# Patient Record
Sex: Male | Born: 1935 | Race: White | Hispanic: No | Marital: Married | Smoking: Never smoker
Health system: Southern US, Community
[De-identification: ages and names within clinical notes are randomized; demographics above are authoritative.]

## PROBLEM LIST (undated history)

## (undated) DIAGNOSIS — K219 Gastro-esophageal reflux disease without esophagitis: Secondary | ICD-10-CM

## (undated) DIAGNOSIS — F3289 Other specified depressive episodes: Secondary | ICD-10-CM

## (undated) DIAGNOSIS — E785 Hyperlipidemia, unspecified: Secondary | ICD-10-CM

## (undated) DIAGNOSIS — N182 Chronic kidney disease, stage 2 (mild): Secondary | ICD-10-CM

## (undated) DIAGNOSIS — F329 Major depressive disorder, single episode, unspecified: Secondary | ICD-10-CM

## (undated) DIAGNOSIS — I6529 Occlusion and stenosis of unspecified carotid artery: Secondary | ICD-10-CM

## (undated) DIAGNOSIS — E559 Vitamin D deficiency, unspecified: Secondary | ICD-10-CM

## (undated) DIAGNOSIS — M1710 Unilateral primary osteoarthritis, unspecified knee: Secondary | ICD-10-CM

## (undated) DIAGNOSIS — IMO0002 Reserved for concepts with insufficient information to code with codable children: Secondary | ICD-10-CM

## (undated) DIAGNOSIS — R7301 Impaired fasting glucose: Secondary | ICD-10-CM

## (undated) DIAGNOSIS — F411 Generalized anxiety disorder: Secondary | ICD-10-CM

## (undated) DIAGNOSIS — G4733 Obstructive sleep apnea (adult) (pediatric): Secondary | ICD-10-CM

## (undated) DIAGNOSIS — J309 Allergic rhinitis, unspecified: Secondary | ICD-10-CM

## (undated) DIAGNOSIS — R413 Other amnesia: Secondary | ICD-10-CM

## (undated) DIAGNOSIS — R2689 Other abnormalities of gait and mobility: Secondary | ICD-10-CM

## (undated) HISTORY — DX: Occlusion and stenosis of unspecified carotid artery: I65.29

## (undated) HISTORY — DX: Other abnormalities of gait and mobility: R26.89

## (undated) HISTORY — DX: Vitamin D deficiency, unspecified: E55.9

## (undated) HISTORY — DX: Unilateral primary osteoarthritis, unspecified knee: M17.10

## (undated) HISTORY — DX: Gastro-esophageal reflux disease without esophagitis: K21.9

## (undated) HISTORY — DX: Hyperlipidemia, unspecified: E78.5

## (undated) HISTORY — DX: Generalized anxiety disorder: F41.1

## (undated) HISTORY — DX: Impaired fasting glucose: R73.01

## (undated) HISTORY — PX: CAROTID ENDARTERECTOMY: SUR193

## (undated) HISTORY — DX: Other specified depressive episodes: F32.89

## (undated) HISTORY — DX: Chronic kidney disease, stage 2 (mild): N18.2

## (undated) HISTORY — DX: Major depressive disorder, single episode, unspecified: F32.9

## (undated) HISTORY — DX: Allergic rhinitis, unspecified: J30.9

## (undated) HISTORY — PX: TURP VAPORIZATION: SUR1397

## (undated) HISTORY — DX: Reserved for concepts with insufficient information to code with codable children: IMO0002

## (undated) HISTORY — DX: Obstructive sleep apnea (adult) (pediatric): G47.33

## (undated) HISTORY — DX: Other amnesia: R41.3

---

## 2000-09-30 ENCOUNTER — Encounter: Admission: RE | Admit: 2000-09-30 | Discharge: 2000-09-30 | Payer: Self-pay | Admitting: Family Medicine

## 2000-09-30 ENCOUNTER — Encounter: Payer: Self-pay | Admitting: Family Medicine

## 2003-01-01 ENCOUNTER — Encounter: Payer: Self-pay | Admitting: Family Medicine

## 2003-01-01 ENCOUNTER — Encounter: Admission: RE | Admit: 2003-01-01 | Discharge: 2003-01-01 | Payer: Self-pay | Admitting: Family Medicine

## 2003-01-03 ENCOUNTER — Encounter: Payer: Self-pay | Admitting: Family Medicine

## 2003-01-03 ENCOUNTER — Encounter: Admission: RE | Admit: 2003-01-03 | Discharge: 2003-01-03 | Payer: Self-pay | Admitting: Family Medicine

## 2003-08-22 ENCOUNTER — Encounter: Admission: RE | Admit: 2003-08-22 | Discharge: 2003-08-22 | Payer: Self-pay | Admitting: Family Medicine

## 2004-11-28 ENCOUNTER — Encounter (INDEPENDENT_AMBULATORY_CARE_PROVIDER_SITE_OTHER): Payer: Self-pay | Admitting: *Deleted

## 2004-11-28 ENCOUNTER — Inpatient Hospital Stay (HOSPITAL_COMMUNITY): Admission: RE | Admit: 2004-11-28 | Discharge: 2004-11-29 | Payer: Self-pay | Admitting: Vascular Surgery

## 2005-08-17 ENCOUNTER — Encounter: Admission: RE | Admit: 2005-08-17 | Discharge: 2005-08-17 | Payer: Self-pay | Admitting: Family Medicine

## 2005-12-30 ENCOUNTER — Ambulatory Visit: Payer: Self-pay | Admitting: Gastroenterology

## 2006-01-12 ENCOUNTER — Ambulatory Visit: Payer: Self-pay | Admitting: Gastroenterology

## 2006-01-12 ENCOUNTER — Encounter (INDEPENDENT_AMBULATORY_CARE_PROVIDER_SITE_OTHER): Payer: Self-pay | Admitting: Specialist

## 2007-01-04 ENCOUNTER — Ambulatory Visit: Payer: Self-pay | Admitting: Vascular Surgery

## 2008-04-03 ENCOUNTER — Ambulatory Visit: Payer: Self-pay | Admitting: Vascular Surgery

## 2008-04-11 ENCOUNTER — Encounter: Admission: RE | Admit: 2008-04-11 | Discharge: 2008-04-11 | Payer: Self-pay | Admitting: Family Medicine

## 2008-09-12 ENCOUNTER — Emergency Department (HOSPITAL_COMMUNITY): Admission: EM | Admit: 2008-09-12 | Discharge: 2008-09-12 | Payer: Self-pay | Admitting: Emergency Medicine

## 2008-09-13 ENCOUNTER — Inpatient Hospital Stay (HOSPITAL_COMMUNITY): Admission: EM | Admit: 2008-09-13 | Discharge: 2008-09-16 | Payer: Self-pay | Admitting: Emergency Medicine

## 2008-10-16 ENCOUNTER — Ambulatory Visit (HOSPITAL_COMMUNITY): Admission: RE | Admit: 2008-10-16 | Discharge: 2008-10-16 | Payer: Self-pay | Admitting: Neurosurgery

## 2009-09-28 HISTORY — PX: INGUINAL HERNIA REPAIR: SHX194

## 2010-02-28 ENCOUNTER — Encounter: Admission: RE | Admit: 2010-02-28 | Discharge: 2010-02-28 | Payer: Self-pay | Admitting: General Surgery
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
## 2011-02-10 NOTE — Assessment & Plan Note (Signed)
OFFICE VISIT   [name], Allen Vega  DOB:  March 13, 1925                                       04/03/2008  ZOXWR#:[id number]   The patient is status post right-sided endarterectomy done in March of  2006 for severe right internal carotid stenosis.  He had suffered some  episodes of severe dizziness on two occasions and he had an 80-90% right  internal carotid stenosis.  He has done well since that time.  He was  referred back today because of some recent dizziness which he has  experienced.  Each spell lasting up to 1 minute.  He becomes nauseated  but does not have syncope or any other neurologic symptoms such as  hemiparesis, aphasia, amaurosis fugax, diplopia, although he does have  times when he has a fuzzy vision around the perimeter of both visual  fields but nothing in one eye only.  He has been taking Aggrenox.  He  has had no cardiac symptoms such as chest pain, dyspnea on exertion, PND  or orthopnea.  He has no claudication symptoms but does not ambulate  long distances because of arthritis.   PHYSICAL EXAMINATION:  Vital signs:  Blood pressure 136/72, heart rate  66, respirations 14.  Carotid pulses 3+ with no audible bruits.  Right  neck incision is well healed.  Neurological:  Normal.  Chest:  Clear to  auscultation.  Cardiovascular:  Reveals a regular rhythm with no  murmurs.  Abdomen:  Soft, nontender with no masses.  There are 3+  femoral, popliteal and posterior tibial pulses palpable bilaterally.   Carotid duplex exam reveals no evidence of restenosis in his right  carotid endarterectomy site and the left internal carotid has minimal  stenosis.  Both vertebral arteries have antegrade flow.   There is no evidence of a vascular cause for his dizziness and I do not  think he is having posterior circulation TIAs per se.  I do not think  any further vascular evaluation is necessary at this time.   Quita Skye Hart Rochester, M.D.  Electronically Signed   JDL/MEDQ  D:  04/03/2008  T:  04/04/2008  Job:  [id number]   cc:   Quita Skye. Artis Flock, M.D.

## 2011-02-10 NOTE — Procedures (Signed)
CAROTID DUPLEX EXAM   INDICATION:  Dizziness.   HISTORY:  Diabetes:  No.  Cardiac:  No.  Hypertension:  No.  Smoking:  Quit >10 years ago.  Previous Surgery:  Right carotid endarterectomy on 11/28/04.  CV History:  No.  Amaurosis Fugax No, Paresthesias No, Hemiparesis No.                                       RIGHT             LEFT  Brachial systolic pressure:         148               138  Brachial Doppler waveforms:         Normal            Normal  Vertebral direction of flow:        Antegrade         Antegrade  DUPLEX VELOCITIES (cm/sec)  CCA peak systolic                   133               140  ECA peak systolic                   134               141  ICA peak systolic                   75                89  ICA end diastolic                   12                20  PLAQUE MORPHOLOGY:                  None              Mixed  PLAQUE AMOUNT:                      None              Mild  PLAQUE LOCATION:                    None  Bifurcation/proximal ICA   IMPRESSION:  1. Patent right carotid endarterectomy site with no evidence of      stenosis.  2. 1-39% stenosis of the left internal carotid artery.  3. No significant change noted in the bilateral carotid arteries since      the previous examination on 01/04/07.   ___________________________________________  Quita Skye. Hart Rochester, M.D.   CH/MEDQ  D:  04/03/2008  T:  04/03/2008  Job:  [id number]

## 2011-02-13 NOTE — Discharge Summary (Signed)
Allen Vega, Allen Vega NO.:  [id number]   MEDICAL RECORD NO.:  [id number]          PATIENT TYPE:  INP   LOCATION:  3312                         FACILITY:  [facility]   PHYSICIAN:  Quita Skye. Hart Rochester, M.D.  DATE OF BIRTH:  August 06, 1925   DATE OF ADMISSION:  11/28/2004  DATE OF DISCHARGE:  11/29/2004                                 DISCHARGE SUMMARY   ADMISSION DIAGNOSIS:  Severe right internal carotid artery stenosis,  asymptomatic.   DISCHARGE DIAGNOSIS:  1.  Severe right internal carotid artery stenosis, asymptomatic, status post      right carotid artery endarterectomy.  2.  Hyperlipidemia.  3.  History of prostate surgery.  4.  History of bladder and urethral surgeries.  5.  Hyperlipidemia.  6.  History of hemorrhoids.  7.  No known drug allergies.  8.  History of hiatal hernia with history of reflux esophagitis.   PROCEDURE:  November 28, 2004, right carotid endarterectomy with Dacron patch  angioplasty by Dr. Quita Skye. Hart Rochester.   BRIEF HISTORY:  Mr. Allen Vega is a 77 year old male who was found to have  significant right carotid occlusive disease in 2004 after suffering two  dizzy spells lasting less than a minute which he described as the room  spinning.  These resolved spontaneously.  He denied any history of  hemiparesis, dysphagia, visual changes, diplopia, or true syncope.  He was  evaluated by Dr. Hart Rochester in December 2004 and found to have 70% right  internal carotid artery stenosis based on a duplex scan done at [facility].  There was mild stenosis on the left.  Their recommendation was  to follow this with serial carotid Duplex.  Repeat Duplex done at [facility] on October 22, 2004, revealed significant progression of  disease to 80% or greater stenosis in the right internal carotid artery.  He  was referred back to Dr. Quita Skye. Hart Rochester for possible right carotid  endarterectomy.  He remained asymptomatic.   HOSPITAL COURSE:  Mr.  Kalb was electively admitted to [facility]  on November 28, 2004, and did undergo a right carotid endarterectomy.  There  were no known interoperative complications and he was extubated  neurologically intact.  After a short stay in the recovery unit, he was  transferred to unit 3300 step down unit for further management.  On the  morning of postoperative day one, Mr. Trunnell remained hemodynamically stable.  He was saturating 98% on room air.  He was neurologically intact with no  dysphagia.  His only complaint was a mild sore throat.  There was very  slight tongue deviation to the right.  His neck dressing was clean, dry, and  intact with no evidence of hematoma or drainage.  His lungs were clear.  His  heart was regular.  His abdomen was soft and nontender.  Early that morning,  the Foley catheter was discontinued as well as his arterial line.  Later, he  was able to void without difficulty.  He was also advanced to a regular diet  and mobilized in the hallway.  Since  he had met the above criteria, it was  felt that he would be stable for discharge home on November 29, 2004.   DISCHARGE MEDICATIONS:  1.  Lipitor 40 mg daily.  2.  Aggrenox 25/200 SR 1 capsule b.i.d.  3.  Aspirin 81 mg daily.  4.  Prilosec 20 mg daily.  5.  Multi-vitamin daily.  6.  Etodolac 500 mg 2 tablets daily.  7.  Coenzyme Q10 100 mg daily.  8.  L-Citrulline 600 mg daily.  9.  L-Arginine 500 mg t.i.d.  10. Over the counter antihistamine p.r.n.  11. Tylox 1-2 tablets p.o. q.4h. p.r.n. pain.   DISCHARGE INSTRUCTIONS:  Activities:  He is instructed to avoid driving or  heavy lifting more than 10 pounds and strenuous exercises.  He was to  continue to increase his daily walking as able.  He is to follow a low fat,  low salt diet.  He may shower starting November 30, 2004.  He should notify the  CVTS office if he develops fever greater than 101, redness or drainage from  his incision site, or changes in  his  neurological status.  He is to follow  up with Dr. Hart Rochester at the CVTS office on Tuesday, December 09, 2004, at 1:40  p.m.      AWZ/MEDQ  D:  01/27/2005  T:  01/27/2005  Job:  [id number]

## 2011-02-13 NOTE — H&P (Signed)
NAMEISAIR, INABINET NO.:  [id number]   MEDICAL RECORD NO.:  [id number]          PATIENT TYPE:  INP   LOCATION:                               FACILITY:  [facility]   PHYSICIAN:  Quita Skye. Hart Rochester, M.D.  DATE OF BIRTH:  November 06, 1924   DATE OF ADMISSION:  11/28/2004  DATE OF DISCHARGE:                                HISTORY & PHYSICAL   CHIEF COMPLAINT:  Severe right internal carotid stenosis - asymptomatic.   HISTORY OF PRESENT ILLNESS:  This 77 year old gentleman was found to have  significant right carotid occlusive disease in 2004 after suffering 2 dizzy  spells lasting less than a minute, which he described as the room spinning,  but him unable to stand.  These resolved spontaneously, and he denied any  other specific hemispheric TIAs such as hemiparesis, aphasia, visual  symptoms, diplopia, or true syncope, and has had no symptoms since that  time.  He was evaluated by me in December of 2004, and was thought to have a  70% right internal carotid stenosis based on duplex scan done at [facility], with a mild stenosis on the left side.  The recommendation was to  follow this.  Repeat duplex scan done at [facility] on  October 22, 2004 revealed significant progression of disease to an 80% or  greater stenosis in the right internal carotid.  He was referred for further  evaluation.  He continues to be asymptomatic.   PAST MEDICAL HISTORY:  1.  Hyperlipidemia.  2.  Negative for diabetes, coronary artery disease, stroke, COPD, deep vein      thrombosis, pulmonary emboli, or hypertension.   PREVIOUS SURGERY:  1.  Prostate surgery.  2.  Bladder and urethra surgery in the past.   FAMILY HISTORY:  Positive for diabetes in his mother, and coronary artery  disease and stroke in his father and 1 sister.   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  Prior to  that, he smoked a pack a day for 30-40 years.  He does not use  alcohol.   REVIEW OF SYSTEMS:  He has occasional discomfort in his legs with walking,  usually related to his knees and arthritis.  He denies any cardiac symptoms  such as chest pain, dyspnea on exertion, PND, orthopnea.  Also denies  pulmonary symptoms such as hemoptysis, chronic bronchitis, or any  generalized symptoms such as anorexia or weight loss.  He does have a  history of a hiatal hernia with reflux esophagitis, and some chronic  constipation.   ALLERGIES:  None known.   MEDICATIONS:  1.  Aggrenox 200 mg two per day.  2.  Lipitor 40 mg one per day.  3.  Etodolac 500 mg two per day.  4.  Prilosec 20 mg one per day.  5.  CoQ10 100 mg one per day.  6.  L-Citrulline 600 mg one per day.  7.  L-Arginine 500 mg three per day.  8.  Multivitamins daily.  9.  An antihistamine as necessary.   PHYSICAL EXAMINATION:  VITAL  SIGNS:  Blood pressure 130/80, heart rate 70,  respirations 16.  GENERAL:  This is a healthy-appearing male who is in no apparent distress.  He is alert and oriented x3.  NECK:  Supple with 3+ carotid pulses, with a soft bruit audible on the  right.  There is no palpable adenopathy in the neck.  NEUROLOGIC:  Normal.  His extraocular muscles were intact.  CHEST:  Clear to auscultation.  CARDIOVASCULAR:  Regular rhythm with no murmurs.  ABDOMEN:  Soft, nontender, with no palpable masses or organomegaly.  EXTREMITIES:  Upper extremity pulses were 3+ bilaterally at the brachial and  radial level.  He has 3+ femoral, popliteal, and posterior tibia pulses  palpable bilaterally with no edema or ischemia.   A repeat carotid duplex exam performed in the CVTS office on November 18, 2004 was consistent with an approximate 80% right internal carotid stenosis.  After a discussion with the patient of risks and benefits, the patient and  his wife decided that he would proceed with right carotid endarterectomy.   IMPRESSION:  1.  Severe right internal carotid stenosis -  asymptomatic.  2.  Hiatal hernia with history of reflux esophagitis.  3.  Hyperlipidemia.   PLAN:  Admit on Friday, November 28, 2004, for an elective right carotid  endarterectomy.       ___________________________________________  Quita Skye Hart Rochester, M.D.    JDL/MEDQ  D:  11/18/2004  T:  11/18/2004  Job:  [id number]   cc:   Jaclyn Prime. Lucas Mallow, M.D.  [address]  [place]  [place] [address]  Fax: [phone]   Quita Skye. Artis Flock, M.D.  [address], [address]  [place]  [place] [address]  Fax: [phone]
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
## 2011-02-13 NOTE — Op Note (Signed)
NAMEDOUGLASS, DUNSHEE NO.:  [id number]   MEDICAL RECORD NO.:  [id number]          PATIENT TYPE:  INP   LOCATION:  2899                         FACILITY:  [facility]   PHYSICIAN:  Quita Skye. Hart Rochester, M.D.  DATE OF BIRTH:  1925/04/02   DATE OF PROCEDURE:  11/28/2004  DATE OF DISCHARGE:                                 OPERATIVE REPORT   REFERRING PHYSICIAN:  Jaclyn Prime. Lucas Mallow, M.D.   PREOPERATIVE DIAGNOSIS:  Right internal carotid artery stenosis with  possible symptoms of diffuse cerebral ischemia.   POSTOPERATIVE DIAGNOSIS:  Right internal carotid artery stenosis with  possible symptoms of diffuse cerebral ischemia.   OPERATION:  Right carotid endarterectomy with Dacron patch angioplasty.   SURGEON:  Quita Skye. Hart Rochester, M.D.   FIRST ASSISTANT:  Jerold Coombe, P.A.   ANESTHESIA:  General endotracheal anesthesia.   BRIEF HISTORY:  This patient has been known to have moderate carotid  occlusive disease in the past which has been followed.  Recently, he had an  episode of severe dizziness on two occasions and repeat carotid Duplex exam  revealed progression of disease to an 80 to 90% level and he was scheduled  for a right carotid endarterectomy.  He had mild disease on the  contralateral left side.   PROCEDURE:  The patient was taken to the operating room and placed in the  supine position at which time satisfactory general endotracheal anesthesia  was administered.  The right neck was prepped with Betadine scrubbing  solution and draped in routine sterile manner.  Incision was made along the  anterior border of the sternocleidomastoid muscle and carried down to the  subcutaneous tissue and platysma using the Bovie.  The common facial vein  and external jugular vein was ligated with 3-0 silk ties and divided  exposing the common, internal and external carotid arteries.  Care was taken  not to enter the vagus or hypoglossal nerves both of which were exposed.  The  carotid bifurcation was quite high necessitating mobilization of the  hypoglossal nerve to get adequate distal exposure behind the posterior belly  of the digastric muscle.  Plaque extended up the internal carotid artery  about 4 cm distal to the bifurcation.  A #10 shunt was prepared and the  patient was heparinized.  The carotid vessels were occluded with vascular  clamps.  Longitudinal opening made in the common carotid with the 15 blade  extending up the internal carotid with Potts scissors to the point distal to  the disease.  The #10 shunt was inserted without difficulty reestablishing  flow in about two minutes.  There was a severely stenotic lesion at least 80  to 90% which was ulcerated, the most severe area of which was about 3 cm  distal to the origin.  Standard endarterectomy was then performed using the  elevator and Potts scissors with an eversion endarterectomy of the external  carotid.  The plaque feathered off the distal internal carotid artery nicely  not requiring any tacking sutures.  The lumen was thoroughly irrigated with  heparin and saline and all loose debris  carefully removed.  Arterotomy was  closed with a patch using continuous 6-0 Prolene.  Prior to completion of  the closure, the shunt was removed after about 30 minutes of shunt time  following antegrade and retrograde flushing.  The closure was completed with  reestablishment of the flow initially up the external and up the internal  branch.  Carotid was occluded for less than two minutes  before removal of the shunt.  Protamine was then given to reverse the  heparin.  Following adequate hemostasis, wound was irrigated with saline,  closed in layers with Vicryl in the subcuticular fashion.  Sterile dressing  applied.  The patient taken to the recovery room in satisfactory condition.      JDL/MEDQ  D:  11/28/2004  T:  11/28/2004  Job:  [id number]   cc:   Jaclyn Prime. Lucas Mallow, M.D.  [address]   [place]  [place] [address]  Fax: [phone]

## 2011-07-03 LAB — CBC
Hemoglobin: 14.5 g/dL (ref 13.0–17.0)
Hemoglobin: 14.9 g/dL (ref 13.0–17.0)
Hemoglobin: 15.5 g/dL (ref 13.0–17.0)
MCHC: 33.2 g/dL (ref 30.0–36.0)
MCHC: 33.6 g/dL (ref 30.0–36.0)
MCHC: 33.6 g/dL (ref 30.0–36.0)
MCV: 90.3 fL (ref 78.0–100.0)
MCV: 91 fL (ref 78.0–100.0)
RBC: 4.77 MIL/uL (ref 4.22–5.81)
RBC: 4.79 MIL/uL (ref 4.22–5.81)
RBC: 4.87 MIL/uL (ref 4.22–5.81)
RBC: 5.12 MIL/uL (ref 4.22–5.81)
RDW: 14.4 % (ref 11.5–15.5)
RDW: 14.7 % (ref 11.5–15.5)
RDW: 14.7 % (ref 11.5–15.5)
WBC: 6.1 10*3/uL (ref 4.0–10.5)

## 2011-07-03 LAB — BASIC METABOLIC PANEL
BUN: 10 mg/dL (ref 6–23)
Calcium: 8.7 mg/dL (ref 8.4–10.5)
GFR calc non Af Amer: >60 mL/min (ref >60)
Glucose, Bld: 87 mg/dL (ref 70–99)
Potassium: 4 mEq/L (ref 3.5–5.1)
Sodium: 140 mEq/L (ref 135–145)

## 2011-07-03 LAB — DIFFERENTIAL
Basophils Absolute: 0 10*3/uL (ref 0.0–0.1)
Basophils Relative: 1 % (ref 0–1)
Lymphocytes Relative: 19 % (ref 12–46)
Lymphs Abs: 1.4 10*3/uL (ref 0.7–4.0)
Monocytes Absolute: 0.8 10*3/uL (ref 0.1–1.0)
Monocytes Relative: 11 % (ref 3–12)

## 2011-07-03 LAB — COMPREHENSIVE METABOLIC PANEL
ALT: 21 U/L (ref 0–53)
AST: 38 U/L — ABNORMAL HIGH (ref 0–37)
Alkaline Phosphatase: 49 U/L (ref 39–117)
CO2: 22 mEq/L (ref 19–32)
Calcium: 9.3 mg/dL (ref 8.4–10.5)
GFR calc non Af Amer: >60 mL/min (ref >60)
Glucose, Bld: 96 mg/dL (ref 70–99)
Potassium: 4.1 mEq/L (ref 3.5–5.1)
Sodium: 135 mEq/L (ref 135–145)
Sodium: 141 mEq/L (ref 135–145)
Total Protein: 5.1 g/dL — ABNORMAL LOW (ref 6.0–8.3)
Total Protein: 6.9 g/dL (ref 6.0–8.3)

## 2011-07-03 LAB — POCT I-STAT, CHEM 8
BUN: 17 mg/dL (ref 6–23)
Chloride: 108 mEq/L (ref 96–112)
Creatinine, Ser: 1.1 mg/dL (ref 0.4–1.5)
Glucose, Bld: 88 mg/dL (ref 70–99)
HCT: 47 % (ref 39.0–52.0)
Hemoglobin: 16 g/dL (ref 13.0–17.0)
Potassium: 4 mEq/L (ref 3.5–5.1)

## 2011-07-03 LAB — URINALYSIS, ROUTINE W REFLEX MICROSCOPIC
Bilirubin Urine: NEGATIVE
Glucose, UA: NEGATIVE mg/dL
Nitrite: NEGATIVE
Protein, ur: NEGATIVE mg/dL
Specific Gravity, Urine: 1.021 (ref 1.005–1.030)
Urobilinogen, UA: 0.2 mg/dL (ref 0.0–1.0)

## 2011-07-03 LAB — APTT: aPTT: 25 seconds (ref 24–37)

## 2011-07-03 LAB — PROTIME-INR
INR: 1 (ref 0.00–1.49)
Prothrombin Time: 13.6 seconds (ref 11.6–15.2)

## 2012-09-07 ENCOUNTER — Other Ambulatory Visit: Payer: Self-pay | Admitting: Otolaryngology

## 2012-09-07 DIAGNOSIS — H905 Unspecified sensorineural hearing loss: Secondary | ICD-10-CM

## 2012-09-12 ENCOUNTER — Ambulatory Visit
Admission: RE | Admit: 2012-09-12 | Discharge: 2012-09-12 | Disposition: A | Discharge status: Home / Self Care | Payer: Medicare Other | Source: Ambulatory Visit | Attending: Otolaryngology | Admitting: Otolaryngology

## 2012-09-12 DIAGNOSIS — H903 Sensorineural hearing loss, bilateral: Secondary | ICD-10-CM

## 2012-09-12 DIAGNOSIS — H905 Unspecified sensorineural hearing loss: Secondary | ICD-10-CM

## 2012-09-12 MED ORDER — GADOBENATE DIMEGLUMINE 529 MG/ML IV SOLN
20.0000 mL | Freq: Once | INTRAVENOUS | Status: AC | PRN
  Administered 2012-09-12: 20 mL via INTRAVENOUS

## 2012-09-13 ENCOUNTER — Other Ambulatory Visit: Payer: Self-pay

## 2012-10-14 ENCOUNTER — Other Ambulatory Visit: Payer: Self-pay | Admitting: Otolaryngology

## 2012-10-14 DIAGNOSIS — H7011 Chronic mastoiditis, right ear: Secondary | ICD-10-CM

## 2012-10-18 ENCOUNTER — Ambulatory Visit
Admission: RE | Admit: 2012-10-18 | Discharge: 2012-10-18 | Disposition: A | Discharge status: Home / Self Care | Payer: Medicare Other | Source: Ambulatory Visit | Attending: Otolaryngology | Admitting: Otolaryngology

## 2012-10-18 ENCOUNTER — Ambulatory Visit: Admission: RE | Admit: 2012-10-18 | Payer: Medicare Other | Source: Ambulatory Visit

## 2012-10-18 ENCOUNTER — Other Ambulatory Visit: Payer: Self-pay | Admitting: Otolaryngology

## 2012-10-18 ENCOUNTER — Other Ambulatory Visit: Payer: Medicare Other

## 2012-10-18 DIAGNOSIS — H7011 Chronic mastoiditis, right ear: Secondary | ICD-10-CM

## 2012-10-20 ENCOUNTER — Other Ambulatory Visit: Payer: Medicare Other

## 2012-12-01 ENCOUNTER — Other Ambulatory Visit: Payer: Self-pay | Admitting: Otolaryngology

## 2013-05-08 ENCOUNTER — Ambulatory Visit (INDEPENDENT_AMBULATORY_CARE_PROVIDER_SITE_OTHER): Payer: Medicare Other | Admitting: Neurology

## 2013-05-08 ENCOUNTER — Encounter: Payer: Self-pay | Admitting: Neurology

## 2013-05-08 VITALS — BP 142/67 | HR 74 | Temp 97.3°F | Ht 74.0 in | Wt 198.0 lb

## 2013-05-08 DIAGNOSIS — G4733 Obstructive sleep apnea (adult) (pediatric): Secondary | ICD-10-CM

## 2013-05-08 DIAGNOSIS — R269 Unspecified abnormalities of gait and mobility: Secondary | ICD-10-CM

## 2013-05-08 DIAGNOSIS — F329 Major depressive disorder, single episode, unspecified: Secondary | ICD-10-CM

## 2013-05-08 DIAGNOSIS — R413 Other amnesia: Secondary | ICD-10-CM

## 2013-05-08 DIAGNOSIS — Z9181 History of falling: Secondary | ICD-10-CM

## 2013-05-08 DIAGNOSIS — R2689 Other abnormalities of gait and mobility: Secondary | ICD-10-CM

## 2013-05-08 HISTORY — DX: Other abnormalities of gait and mobility: R26.89

## 2013-05-08 NOTE — Patient Instructions (Signed)
I think overall you are doing fairly well but I do want to suggest a few things today:  Remember to drink plenty of fluid, eat healthy meals and do not skip any meals. Try to eat protein with a every meal and eat a healthy snack such as fruit or nuts in between meals. Try to keep a regular sleep-wake schedule and try to exercise daily, particularly in the form of walking, 20-30 minutes a day, if you can.   As far as your medications are concerned, I would like to suggest no changes.   Please continue PT, their exercises, and walk with a cane, use your CPAP.    As far as diagnostic testing: I will get the MRI report form Dr. Dorma Russell, and blood work results from Dr. Thea Silversmith.   I would like to see you back in 3 months, sooner if we need to. Please call us with any interim questions, concerns, problems, updates or refill requests.  Brett Canales is my clinical assistant and will answer any of your questions and relay your messages to me and also relay most of my messages to you.  Our phone number is [phone]. We also have an after hours call service for urgent matters and there is a physician on-call for urgent questions. For any emergencies you know to call 911 or go to the nearest emergency room.
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
## 2013-08-28 ENCOUNTER — Encounter (INDEPENDENT_AMBULATORY_CARE_PROVIDER_SITE_OTHER): Payer: Self-pay

## 2013-08-28 ENCOUNTER — Encounter: Payer: Self-pay | Admitting: Neurology

## 2013-08-28 ENCOUNTER — Ambulatory Visit (INDEPENDENT_AMBULATORY_CARE_PROVIDER_SITE_OTHER): Payer: Medicare Other | Admitting: Neurology

## 2013-08-28 VITALS — BP 131/59 | HR 71 | Ht 74.0 in | Wt 196.0 lb

## 2013-08-28 DIAGNOSIS — R4189 Other symptoms and signs involving cognitive functions and awareness: Secondary | ICD-10-CM

## 2013-08-28 DIAGNOSIS — F919 Conduct disorder, unspecified: Secondary | ICD-10-CM

## 2013-08-28 DIAGNOSIS — R413 Other amnesia: Secondary | ICD-10-CM

## 2013-08-28 DIAGNOSIS — R269 Unspecified abnormalities of gait and mobility: Secondary | ICD-10-CM

## 2013-08-28 DIAGNOSIS — F329 Major depressive disorder, single episode, unspecified: Secondary | ICD-10-CM

## 2013-08-28 DIAGNOSIS — G4733 Obstructive sleep apnea (adult) (pediatric): Secondary | ICD-10-CM

## 2013-08-28 HISTORY — DX: Other amnesia: R41.3

## 2013-08-28 NOTE — Patient Instructions (Addendum)
We will do more formal memory testing - I will refer you to a neuropsychologist. If you don't hear back about an appointment in 1 week from now, call us back and we will follow up on the referral.  For your gait disorder, I want you to use a cane at all times. Do not walk for more than 20 minutes at a time.  Please use your CPAP every night for sleep.
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
## 2013-11-30 ENCOUNTER — Encounter: Payer: Self-pay | Admitting: Neurology

## 2013-11-30 ENCOUNTER — Ambulatory Visit (INDEPENDENT_AMBULATORY_CARE_PROVIDER_SITE_OTHER): Payer: Medicare Other | Admitting: Neurology

## 2013-11-30 ENCOUNTER — Encounter (INDEPENDENT_AMBULATORY_CARE_PROVIDER_SITE_OTHER): Payer: Self-pay

## 2013-11-30 VITALS — BP 119/64 | HR 78 | Temp 98.6°F | Ht 72.0 in | Wt 202.0 lb

## 2013-11-30 DIAGNOSIS — R413 Other amnesia: Secondary | ICD-10-CM

## 2013-11-30 DIAGNOSIS — H9190 Unspecified hearing loss, unspecified ear: Secondary | ICD-10-CM

## 2013-11-30 DIAGNOSIS — R2689 Other abnormalities of gait and mobility: Secondary | ICD-10-CM

## 2013-11-30 DIAGNOSIS — F03918 Unspecified dementia, unspecified severity, with other behavioral disturbance: Secondary | ICD-10-CM

## 2013-11-30 DIAGNOSIS — F3289 Other specified depressive episodes: Secondary | ICD-10-CM

## 2013-11-30 DIAGNOSIS — F0391 Unspecified dementia with behavioral disturbance: Secondary | ICD-10-CM

## 2013-11-30 DIAGNOSIS — G4733 Obstructive sleep apnea (adult) (pediatric): Secondary | ICD-10-CM

## 2013-11-30 DIAGNOSIS — F919 Conduct disorder, unspecified: Secondary | ICD-10-CM

## 2013-11-30 DIAGNOSIS — S060X9A Concussion with loss of consciousness of unspecified duration, initial encounter: Secondary | ICD-10-CM

## 2013-11-30 DIAGNOSIS — R4689 Other symptoms and signs involving appearance and behavior: Secondary | ICD-10-CM

## 2013-11-30 DIAGNOSIS — F329 Major depressive disorder, single episode, unspecified: Secondary | ICD-10-CM

## 2013-11-30 DIAGNOSIS — R4189 Other symptoms and signs involving cognitive functions and awareness: Secondary | ICD-10-CM

## 2013-11-30 DIAGNOSIS — F32A Depression, unspecified: Secondary | ICD-10-CM

## 2013-11-30 DIAGNOSIS — R269 Unspecified abnormalities of gait and mobility: Secondary | ICD-10-CM

## 2013-11-30 DIAGNOSIS — S060XAA Concussion with loss of consciousness status unknown, initial encounter: Secondary | ICD-10-CM

## 2013-11-30 DIAGNOSIS — H9191 Unspecified hearing loss, right ear: Secondary | ICD-10-CM

## 2013-11-30 MED ORDER — DONEPEZIL HCL 5 MG PO TABS: 5.0000 mg | ORAL_TABLET | Freq: Every day | ORAL | Status: DC

## 2013-11-30 NOTE — Patient Instructions (Addendum)
I think overall you are doing fairly well but I do want to suggest a few things today:  Remember to drink plenty of fluid, eat healthy meals and do not skip any meals. Try to eat protein with a every meal and eat a healthy snack such as fruit or nuts in between meals. Try to keep a regular sleep-wake schedule and try to exercise daily, particularly in the form of walking, 20-30 minutes a day, if you can. Reduce your time at the gym to 45 minutes at a time and try to get a trainer for some time.   Engage in social activities in your community and with your family and try to keep up with current events by reading the newspaper or watching the news.   As far as your medications are concerned, I would like to suggest Aricept (generic name: donepezil) 5 mg: take one pill each evening. Common side effects include dry eyes, dry mouth, confusion, low pulse, low blood pressure and rare side effects include hallucinations.    As far as diagnostic testing: we will do a brain MRI.  I would like to see you back in 3 months, sooner if we need to. Please call us with any interim questions, concerns, problems, updates or refill requests.  Please also call us for any test results so we can go over those with you on the phone. Our nursing staff will answer any of your questions and relay your messages to me and also relay most of my messages to you.  Our phone number is [phone]. We also have an after hours call service for urgent matters and there is a physician on-call for urgent questions. For any emergencies you know to call 911 or go to the nearest emergency room.
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
## 2013-12-12 ENCOUNTER — Ambulatory Visit
Admission: RE | Admit: 2013-12-12 | Discharge: 2013-12-12 | Disposition: A | Discharge status: Home / Self Care | Payer: Medicare Other | Source: Ambulatory Visit | Attending: Neurology | Admitting: Neurology

## 2013-12-12 DIAGNOSIS — R413 Other amnesia: Secondary | ICD-10-CM

## 2013-12-12 DIAGNOSIS — F03918 Unspecified dementia, unspecified severity, with other behavioral disturbance: Secondary | ICD-10-CM

## 2013-12-12 DIAGNOSIS — F0391 Unspecified dementia with behavioral disturbance: Secondary | ICD-10-CM

## 2013-12-13 ENCOUNTER — Telehealth: Payer: Self-pay | Admitting: *Deleted

## 2013-12-13 NOTE — Telephone Encounter (Signed)
Pls advise wife: The Aricept will unlikely make a difference in 2 weeks. In fact it is not a medication that will help his attitude, his personality or mood disorder. This is a memory medication that is designed to help preserve cognitive function. If memory does improve it may do this slowly and with long-term use. However, keep in mind, that we may not see an improvement in his memory as opposed to a plateauing effect. For his mood disorder, I would recommend they see Dr. Donell BeersPlovsky again.

## 2013-12-13 NOTE — Telephone Encounter (Signed)
Called wife and shared Dr Teofilo PodAthar's telephone message, she verbalized understanding but said that they had seen Dr Donell BeersPlovsky about three years ago and he suggested that they see a marriage Veterinary surgeoncounselor. Asked patient to sign a release to have records sent here so that Dr Frances FurbishAthar may review if needed, she said that she would try to get them.
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
## 2013-12-13 NOTE — Telephone Encounter (Signed)
Patient's wife was given the MRI results,  The patient's wife states he has been on the Aricept for 2 weeks but she has not seen any change, has a bad attitude in general and since the MRI did not show any significant change, she is baffled about how he is acting.  Do you want her to continue with the Aricept and give it a couple more weeks and he also had his anti-depressant increased but she has seen no change.  Please advise.

## 2014-02-26 ENCOUNTER — Telehealth: Payer: Self-pay | Admitting: Neurology

## 2014-02-26 NOTE — Telephone Encounter (Signed)
Patient's wife calling to state that the patient is worse since being on Aricept. He is more aggressive and more defiant. His wife states that his cognitive skills have not changed. Wife would like to stop the medication and try something else. Please call to advise.

## 2014-02-26 NOTE — Telephone Encounter (Signed)
Please send to [facility]  [facility], Dr Terrace Arabia.

## 2014-02-26 NOTE — Telephone Encounter (Signed)
Spoke with patient's wife and she said that patient appears to be worse, more hostile, difficult to deal with when saying no to him. She does't think that it is helping at all.  He is able to read and comphend books   but he is defiant even when going to church or going for a walk. Is there something else he may try?

## 2014-02-27 MED ORDER — MEMANTINE HCL 28 X 5 MG & 21 X 10 MG PO TABS: ORAL_TABLET | ORAL | Status: DC

## 2014-02-27 NOTE — Telephone Encounter (Signed)
I called the patient, talked with the wife. The patient has become somewhat irritable on Aricept. He'll come off of the medication , weight 2 weeks, and then start date Namenda Dosepak. If he does well on this for one month, they are to call our office, we will call in the maintenance dose of medication.

## 2014-03-01 ENCOUNTER — Telehealth: Payer: Self-pay | Admitting: Neurology

## 2014-03-01 NOTE — Telephone Encounter (Signed)
I have spoken with the insurance company.  They have approved our request for coverage on Namenda Titration Pack Ref # [id number].  Once the titration pack is completed, the patient will have to transition to Namenda XR, as regular Namenda is no longer manufactured.  This drug will be covered under the patients plan at a tier 3 co-pay and no tier exception is available for this drug per plan policy.  Ref # [id number].  I spoke with Ms [facility].  She said they were fine with a tier 3 co-pay.  She would like me to contact ins and see if they will lower the co-pay on the titration dose.  I have contacted them, pending response.  Ms Lamore is aware.

## 2014-03-01 NOTE — Telephone Encounter (Signed)
Wife called and stated the Insurance company would like to speak with Dr. Anne Hahn.  Insurance company received request for tier exception and has approved, but medication requested stated Namenda ER instead of Namenda per wife.  Please contact Insurance company @ [phone].

## 2014-03-01 NOTE — Telephone Encounter (Signed)
I have submitted all requested info to ins, pending their response.  I called back. Spoke with Ms [facility], she is aware.

## 2014-03-01 NOTE — Telephone Encounter (Signed)
Patient's wife calling to state that when they went to get the [place] script that Dr. Anne Hahn prescribed, they found that it was too expensive. Patient's wife states that their insurance told them that if the doctor calls the insurance company to request a tier exception, the price can be lowered to be more affordable. Please return call to patient and advise.

## 2014-03-02 ENCOUNTER — Telehealth: Payer: Self-pay | Admitting: Neurology

## 2014-03-02 NOTE — Telephone Encounter (Signed)
Insurance did not approve a tier exception for this drug.  I called the pharmacy.  Spoke with Allen Vega.  He said the patients co-pay is $95, as previously discussed, but they also have not met their deductible, so there is an additional $175 charge until that is met.  We happen to have one titration kit at the office.  I asked Allen Vega to leave it at the front desk for the patient.  I called Allen Vega back.  She will pick up the starter pack on Monday and will use the voucher for one free month next month, which will allow her time to save up to pay for the deductible.  She is going to call ins back and see if they will verify what the regular co-pay will be after the deductible is met.  She will call back if anything further is needed.

## 2014-03-02 NOTE — Telephone Encounter (Signed)
Patient's wife went to the pharmacy and they were denied a discount for the Namenda. Wife is wondering if possibly they have not received a fax from our office yet. They cannot afford the medication without a discount so he will be off meds until his next folllow up apt with Dr. Frances Furbish. Please call wife back to be sure that this is okay to do.

## 2014-03-02 NOTE — Telephone Encounter (Signed)
The cost of the medication is determined by the ins.  We already contacted them and they said the monthly Rx after titration would be a Tier 3, which I explained to Ms [facility] yesterday, and she said they were okay with that.  The ins has denied the request to lower the cost any more.  I called back.  According to Ms [facility], the co-pay for tier 3 would be $45, and she told me she was fine paying this amount.  I recommended she go to [email or web address] to print a voucher for one free month once titration os completed.

## 2014-03-02 NOTE — Telephone Encounter (Signed)
Medication issue. Please advise

## 2014-03-02 NOTE — Telephone Encounter (Signed)
Spouse called and stated Insurance has decided to deny the tier reduction.  She's questioning if we could get the monthly tier reduced.  She's willing to eat the cost of the titration packet, but more concerned about monthly charge.  Please call and advise

## 2014-03-31 ENCOUNTER — Other Ambulatory Visit: Payer: Self-pay | Admitting: Neurology

## 2014-03-31 DIAGNOSIS — F0391 Unspecified dementia with behavioral disturbance: Secondary | ICD-10-CM

## 2014-03-31 DIAGNOSIS — F03918 Unspecified dementia, unspecified severity, with other behavioral disturbance: Secondary | ICD-10-CM

## 2014-03-31 MED ORDER — MEMANTINE HCL ER 28 MG PO CP24: 28.0000 mg | ORAL_CAPSULE | Freq: Every day | ORAL | Status: DC

## 2014-04-19 ENCOUNTER — Ambulatory Visit (INDEPENDENT_AMBULATORY_CARE_PROVIDER_SITE_OTHER): Payer: Medicare Other | Admitting: Neurology

## 2014-04-19 ENCOUNTER — Encounter: Payer: Self-pay | Admitting: Neurology

## 2014-04-19 VITALS — BP 126/68 | HR 78 | Ht 72.0 in | Wt 205.0 lb

## 2014-04-19 DIAGNOSIS — R269 Unspecified abnormalities of gait and mobility: Secondary | ICD-10-CM

## 2014-04-19 DIAGNOSIS — F0391 Unspecified dementia with behavioral disturbance: Secondary | ICD-10-CM

## 2014-04-19 DIAGNOSIS — H9190 Unspecified hearing loss, unspecified ear: Secondary | ICD-10-CM

## 2014-04-19 DIAGNOSIS — F03918 Unspecified dementia, unspecified severity, with other behavioral disturbance: Secondary | ICD-10-CM

## 2014-04-19 DIAGNOSIS — F3289 Other specified depressive episodes: Secondary | ICD-10-CM

## 2014-04-19 DIAGNOSIS — F32A Depression, unspecified: Secondary | ICD-10-CM

## 2014-04-19 DIAGNOSIS — R2689 Other abnormalities of gait and mobility: Secondary | ICD-10-CM

## 2014-04-19 DIAGNOSIS — F329 Major depressive disorder, single episode, unspecified: Secondary | ICD-10-CM

## 2014-04-19 DIAGNOSIS — G4733 Obstructive sleep apnea (adult) (pediatric): Secondary | ICD-10-CM

## 2014-04-19 DIAGNOSIS — H9191 Unspecified hearing loss, right ear: Secondary | ICD-10-CM

## 2014-04-19 MED ORDER — MEMANTINE HCL 10 MG PO TABS: 10.0000 mg | ORAL_TABLET | Freq: Two times a day (BID) | ORAL | Status: DC

## 2014-04-19 NOTE — Patient Instructions (Signed)
I think overall you are doing fairly well but I do want to suggest a few things today:  Remember to drink plenty of fluid, eat healthy meals and do not skip any meals. Try to eat protein with a every meal and eat a healthy snack such as fruit or nuts in between meals. Try to keep a regular sleep-wake schedule and try to exercise daily, particularly in the form of walking, 20-30 minutes a day, if you can. Good nutrition, proper sleep and exercise can help her cognitive function.  Engage in social activities in your community and with your family and try to keep up with current events by reading the newspaper or watching the news. If you have computer and can go online, try [email or web address]. Also, you may like to do word finding puzzles or crossword puzzles.  As far as your medications are concerned, I would like to suggest no changes except changing Namenda XR to generic 10 mg twice daily.   As far as diagnostic testing: no new test.    I would like to see you back in 4 months, sooner if we need to. Please call us with any interim questions, concerns, problems, updates or refill requests.  Our phone number is [phone]. We also have an after hours call service for urgent matters and there is a physician on-call for urgent questions. For any emergencies you know to call 911 or go to the nearest emergency room.
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
## 2014-05-16 ENCOUNTER — Telehealth: Payer: Self-pay | Admitting: *Deleted

## 2014-05-16 ENCOUNTER — Telehealth: Payer: Self-pay | Admitting: Neurology

## 2014-05-16 NOTE — Telephone Encounter (Signed)
[name] from [employer] calling to check on the status of the doctor statement that they faxed over. Please return call and advise.

## 2014-05-16 NOTE — Telephone Encounter (Signed)
Waiting on payment for the form. The form is in medical records.

## 2014-05-17 NOTE — Telephone Encounter (Signed)
LMVM for [place] that MR awaiting on payment for form.  Left her Hoy MornDebra S in MR name to contact.

## 2014-05-18 ENCOUNTER — Telehealth: Payer: Self-pay | Admitting: *Deleted

## 2014-05-18 NOTE — Telephone Encounter (Signed)
I called after consulting Dr. Frances FurbishAthar about form needed for [employer].  She deferred to pcp to fill out as she was consulting MD.  Clent [place] to [place] and form faxed back to her.

## 2014-09-05 ENCOUNTER — Ambulatory Visit (INDEPENDENT_AMBULATORY_CARE_PROVIDER_SITE_OTHER): Payer: Medicare Other | Admitting: Neurology

## 2014-09-05 ENCOUNTER — Encounter: Payer: Self-pay | Admitting: Neurology

## 2014-09-05 VITALS — BP 107/58 | HR 72 | Temp 98.3°F | Ht 72.0 in | Wt 206.0 lb

## 2014-09-05 DIAGNOSIS — F03918 Unspecified dementia, unspecified severity, with other behavioral disturbance: Secondary | ICD-10-CM

## 2014-09-05 DIAGNOSIS — R296 Repeated falls: Secondary | ICD-10-CM

## 2014-09-05 DIAGNOSIS — R269 Unspecified abnormalities of gait and mobility: Secondary | ICD-10-CM

## 2014-09-05 DIAGNOSIS — F0391 Unspecified dementia with behavioral disturbance: Secondary | ICD-10-CM
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
## 2014-09-05 NOTE — Patient Instructions (Addendum)
We will have you see Dr. Donell BeersPlovsky again. I made a referral.  We will have you see Dr. Jacquelyne BalintMcDermott again. I made a referral.  We will do physical therapy again. I made a referral.  We will have an evaluation with speech therapy here, with [facility]. I made a referral.  We will continue with the current medications at this time.  I think it is a good idea to look into Memory Care Facilities.

## 2014-09-11 ENCOUNTER — Telehealth: Payer: Self-pay | Admitting: Neurology

## 2014-09-11 NOTE — Telephone Encounter (Signed)
Wife is calling stating she needs to talk with Dr. Frances FurbishAthar regarding a form that needs to be filled out.  She is thinking about putting patient in [facility].  She may need a letter written stating he needs memory care.  Please call and advise.

## 2014-09-12 ENCOUNTER — Telehealth: Payer: Self-pay | Admitting: Neurology

## 2014-09-12 ENCOUNTER — Encounter: Payer: Self-pay | Admitting: Neurology

## 2014-09-12 NOTE — Telephone Encounter (Signed)
Called and informed patient's wife that letter for memory care facility is ready for pick, she verbalized understanding and will pick up 09/13/14 from front desk

## 2014-09-12 NOTE — Telephone Encounter (Signed)
Spoke with wife and she is going to have internist to complete the F2/1 form but she would like Dr Frances FurbishAthar write a letter saying that the patient has dementia and she feels he needs to be in a memory care facility([facility]).  She would like the letter completed and ready for pick up. She is planning to move patient there the last week of Dec.

## 2014-09-12 NOTE — Telephone Encounter (Signed)
Informed patient's wife that letter is ready at front desk, and she will pick up on 09/13/14

## 2014-09-12 NOTE — Telephone Encounter (Signed)
Letter done. pls notify spouse.

## 2014-09-18 ENCOUNTER — Inpatient Hospital Stay (HOSPITAL_COMMUNITY)
Admission: EM | Admit: 2014-09-18 | Discharge: 2014-09-20 | DRG: 689 | Disposition: A | Discharge status: Skilled Nursing Facility | Payer: Medicare Other | Attending: Internal Medicine | Admitting: Internal Medicine

## 2014-09-18 ENCOUNTER — Telehealth: Payer: Self-pay | Admitting: Neurology

## 2014-09-18 ENCOUNTER — Encounter (HOSPITAL_COMMUNITY): Payer: Self-pay | Admitting: Emergency Medicine

## 2014-09-18 ENCOUNTER — Emergency Department (HOSPITAL_COMMUNITY): Payer: Medicare Other

## 2014-09-18 DIAGNOSIS — N390 Urinary tract infection, site not specified: Secondary | ICD-10-CM | POA: Diagnosis present

## 2014-09-18 DIAGNOSIS — F0280 Dementia in other diseases classified elsewhere without behavioral disturbance: Secondary | ICD-10-CM | POA: Diagnosis present

## 2014-09-18 DIAGNOSIS — N182 Chronic kidney disease, stage 2 (mild): Secondary | ICD-10-CM | POA: Diagnosis present

## 2014-09-18 DIAGNOSIS — I6529 Occlusion and stenosis of unspecified carotid artery: Secondary | ICD-10-CM | POA: Diagnosis present

## 2014-09-18 DIAGNOSIS — G9340 Encephalopathy, unspecified: Secondary | ICD-10-CM

## 2014-09-18 DIAGNOSIS — F0390 Unspecified dementia without behavioral disturbance: Secondary | ICD-10-CM

## 2014-09-18 DIAGNOSIS — G3109 Other frontotemporal dementia: Secondary | ICD-10-CM | POA: Diagnosis present

## 2014-09-18 DIAGNOSIS — E785 Hyperlipidemia, unspecified: Secondary | ICD-10-CM | POA: Diagnosis present

## 2014-09-18 DIAGNOSIS — F419 Anxiety disorder, unspecified: Secondary | ICD-10-CM | POA: Diagnosis present

## 2014-09-18 DIAGNOSIS — E559 Vitamin D deficiency, unspecified: Secondary | ICD-10-CM | POA: Diagnosis present

## 2014-09-18 DIAGNOSIS — M179 Osteoarthritis of knee, unspecified: Secondary | ICD-10-CM | POA: Diagnosis present

## 2014-09-18 DIAGNOSIS — F329 Major depressive disorder, single episode, unspecified: Secondary | ICD-10-CM | POA: Diagnosis present

## 2014-09-18 DIAGNOSIS — J309 Allergic rhinitis, unspecified: Secondary | ICD-10-CM | POA: Diagnosis present

## 2014-09-18 DIAGNOSIS — F0391 Unspecified dementia with behavioral disturbance: Secondary | ICD-10-CM

## 2014-09-18 DIAGNOSIS — Z8673 Personal history of transient ischemic attack (TIA), and cerebral infarction without residual deficits: Secondary | ICD-10-CM | POA: Diagnosis not present

## 2014-09-18 DIAGNOSIS — R410 Disorientation, unspecified: Secondary | ICD-10-CM | POA: Diagnosis present

## 2014-09-18 DIAGNOSIS — G4733 Obstructive sleep apnea (adult) (pediatric): Secondary | ICD-10-CM | POA: Diagnosis present

## 2014-09-18 DIAGNOSIS — R4182 Altered mental status, unspecified: Secondary | ICD-10-CM

## 2014-09-18 DIAGNOSIS — K219 Gastro-esophageal reflux disease without esophagitis: Secondary | ICD-10-CM | POA: Diagnosis present

## 2014-09-18 LAB — URINALYSIS, ROUTINE W REFLEX MICROSCOPIC
Bilirubin Urine: NEGATIVE
GLUCOSE, UA: NEGATIVE mg/dL
Ketones, ur: 15 mg/dL — AB
Nitrite: NEGATIVE
PH: 8 (ref 5.0–8.0)
Protein, ur: 30 mg/dL — AB
SPECIFIC GRAVITY, URINE: 1.022 (ref 1.005–1.030)
Urobilinogen, UA: 1 mg/dL (ref 0.0–1.0)

## 2014-09-18 LAB — CBC WITH DIFFERENTIAL/PLATELET
Basophils Absolute: 0.1 10*3/uL (ref 0.0–0.1)
Basophils Relative: 1 % (ref 0–1)
Eosinophils Absolute: 0.2 10*3/uL (ref 0.0–0.7)
Eosinophils Relative: 2 % (ref 0–5)
HEMATOCRIT: 48.7 % (ref 39.0–52.0)
HEMOGLOBIN: 16.9 g/dL (ref 13.0–17.0)
LYMPHS ABS: 2 10*3/uL (ref 0.7–4.0)
Lymphocytes Relative: 25 % (ref 12–46)
MCH: 31.1 pg (ref 26.0–34.0)
MCHC: 34.7 g/dL (ref 30.0–36.0)
MCV: 89.5 fL (ref 78.0–100.0)
MONO ABS: 0.7 10*3/uL (ref 0.1–1.0)
MONOS PCT: 9 % (ref 3–12)
NEUTROS ABS: 5.2 10*3/uL (ref 1.7–7.7)
NEUTROS PCT: 63 % (ref 43–77)
Platelets: 177 10*3/uL (ref 150–400)
RBC: 5.44 MIL/uL (ref 4.22–5.81)
RDW: 13.9 % (ref 11.5–15.5)
WBC: 8.2 10*3/uL (ref 4.0–10.5)

## 2014-09-18 LAB — BASIC METABOLIC PANEL
Anion gap: 11 (ref 5–15)
BUN: 15 mg/dL (ref 6–23)
CHLORIDE: 110 meq/L (ref 96–112)
CO2: 19 mmol/L (ref 19–32)
CREATININE: 0.9 mg/dL (ref 0.50–1.35)
Calcium: 9.3 mg/dL (ref 8.4–10.5)
GFR calc Af Amer: 86 mL/min — ABNORMAL LOW (ref >90)
GFR calc non Af Amer: 74 mL/min — ABNORMAL LOW (ref >90)
GLUCOSE: 94 mg/dL (ref 70–99)
POTASSIUM: 3.7 mmol/L (ref 3.5–5.1)
Sodium: 140 mmol/L (ref 135–145)

## 2014-09-18 LAB — URINE MICROSCOPIC-ADD ON

## 2014-09-18 LAB — TROPONIN I: Troponin I: <0.03 ng/mL (ref <0.031)

## 2014-09-18 MED ORDER — HEPARIN SODIUM (PORCINE) 5000 UNIT/ML IJ SOLN
5000.0000 [IU] | Freq: Three times a day (TID) | INTRAMUSCULAR | Status: DC
  Administered 2014-09-19 – 2014-09-20 (×4): 5000 [IU] via SUBCUTANEOUS
  Filled 2014-09-18 (×7): qty 1

## 2014-09-18 MED ORDER — VITAMIN B-1 100 MG PO TABS
100.0000 mg | ORAL_TABLET | Freq: Every day | ORAL | Status: DC
  Administered 2014-09-19 – 2014-09-20 (×2): 100 mg via ORAL
  Filled 2014-09-18 (×2): qty 1

## 2014-09-18 MED ORDER — ONDANSETRON HCL 4 MG/2ML IJ SOLN: 4.0000 mg | Freq: Four times a day (QID) | INTRAMUSCULAR | Status: DC | PRN

## 2014-09-18 MED ORDER — SODIUM CHLORIDE 0.9 % IV SOLN
INTRAVENOUS | Status: DC
  Administered 2014-09-18: 23:00:00 via INTRAVENOUS

## 2014-09-18 MED ORDER — PRAVASTATIN SODIUM 20 MG PO TABS
20.0000 mg | ORAL_TABLET | Freq: Every day | ORAL | Status: DC
  Administered 2014-09-19 – 2014-09-20 (×2): 20 mg via ORAL
  Filled 2014-09-18 (×2): qty 1

## 2014-09-18 MED ORDER — VITAMIN D3 25 MCG (1000 UNIT) PO TABS
2000.0000 [IU] | ORAL_TABLET | Freq: Every day | ORAL | Status: DC
  Administered 2014-09-19 – 2014-09-20 (×2): 2000 [IU] via ORAL
  Filled 2014-09-18 (×2): qty 2

## 2014-09-18 MED ORDER — ACETAMINOPHEN 500 MG PO TABS: 500.0000 mg | ORAL_TABLET | Freq: Four times a day (QID) | ORAL | Status: DC | PRN

## 2014-09-18 MED ORDER — FOLIC ACID 1 MG PO TABS
1.0000 mg | ORAL_TABLET | Freq: Every day | ORAL | Status: DC
  Administered 2014-09-19 – 2014-09-20 (×2): 1 mg via ORAL
  Filled 2014-09-18 (×2): qty 1

## 2014-09-18 MED ORDER — HALOPERIDOL LACTATE 5 MG/ML IJ SOLN
5.0000 mg | Freq: Once | INTRAMUSCULAR | Status: AC
  Administered 2014-09-18: 5 mg via INTRAMUSCULAR
  Filled 2014-09-18: qty 1

## 2014-09-18 MED ORDER — LORAZEPAM 2 MG/ML IJ SOLN
1.0000 mg | INTRAMUSCULAR | Status: AC | PRN
  Administered 2014-09-18 (×2): 1 mg via INTRAVENOUS
  Filled 2014-09-18 (×2): qty 1

## 2014-09-18 MED ORDER — DOCUSATE SODIUM 100 MG PO CAPS
100.0000 mg | ORAL_CAPSULE | Freq: Two times a day (BID) | ORAL | Status: DC
  Administered 2014-09-19 – 2014-09-20 (×3): 100 mg via ORAL
  Filled 2014-09-18 (×5): qty 1

## 2014-09-18 MED ORDER — MEMANTINE HCL 10 MG PO TABS
10.0000 mg | ORAL_TABLET | Freq: Two times a day (BID) | ORAL | Status: DC
  Administered 2014-09-19 – 2014-09-20 (×3): 10 mg via ORAL
  Filled 2014-09-18 (×5): qty 1

## 2014-09-18 MED ORDER — ASPIRIN EC 81 MG PO TBEC
81.0000 mg | DELAYED_RELEASE_TABLET | Freq: Every day | ORAL | Status: DC
  Administered 2014-09-19: 81 mg via ORAL
  Filled 2014-09-18 (×2): qty 1

## 2014-09-18 MED ORDER — ADULT MULTIVITAMIN W/MINERALS CH
1.0000 | ORAL_TABLET | Freq: Every day | ORAL | Status: DC
  Administered 2014-09-19 – 2014-09-20 (×2): 1 via ORAL
  Filled 2014-09-18 (×2): qty 1

## 2014-09-18 MED ORDER — CEFTRIAXONE SODIUM IN DEXTROSE 20 MG/ML IV SOLN
1.0000 g | INTRAVENOUS | Status: DC
  Administered 2014-09-19: 1 g via INTRAVENOUS
  Filled 2014-09-18: qty 50

## 2014-09-18 MED ORDER — VENLAFAXINE HCL ER 150 MG PO CP24
150.0000 mg | ORAL_CAPSULE | Freq: Every day | ORAL | Status: DC
  Administered 2014-09-19 – 2014-09-20 (×2): 150 mg via ORAL
  Filled 2014-09-18 (×3): qty 1

## 2014-09-18 MED ORDER — ONDANSETRON HCL 4 MG PO TABS: 4.0000 mg | ORAL_TABLET | Freq: Four times a day (QID) | ORAL | Status: DC | PRN

## 2014-09-18 MED ORDER — DEXTROSE 5 % IV SOLN
1.0000 g | Freq: Once | INTRAVENOUS | Status: AC
  Administered 2014-09-18: 1 g via INTRAVENOUS
  Filled 2014-09-18: qty 10

## 2014-09-18 NOTE — Consult Note (Signed)
Consult Reason for Consult:altered mental status Referring Physician: Dr Ree KidaJames [facility]  CC: altered mental status  HPI: Allen ChestnutRichard F Ducey Jr. is an 80 y.o. male with history of dementia and remote occipital ICH presenting with acute onset confusion. History provided via patients family. Per his wife he acutely became confused this morning and it has persisted. They note increased agitation, not following commands, talking "gibberish". She notes he appeared weak all over. Denies any focal weakness. He has had 2 similar episodes in the past with an unclear etiology. She does note he fell around 2 weeks ago and hit his head, he did not have a head CT since that fall.   Head CT completed in ED reviewed and overall unremarkable. Lab work pertinent for possible UTI.   Past Medical History  Diagnosis Date  . Occlusion and stenosis of carotid artery without mention of cerebral infarction   . Impaired fasting glucose   . Other and unspecified hyperlipidemia   . Obstructive sleep apnea (adult) (pediatric)   . Chronic kidney disease, stage II (mild)   . Osteoarthrosis, unspecified whether generalized or localized, lower leg   . Allergic rhinitis, cause unspecified   . Esophageal reflux   . Unspecified vitamin D deficiency   . Anxiety state, unspecified   . Depressive disorder, not elsewhere classified   . 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 Memory loss 08/28/2013    Past Surgical History  Procedure Laterality Date  . Carotid endarterectomy Right   . Inguinal hernia repair  2011  . Turp vaporization      Family History  Problem Relation Age of Onset  . Family history unknown: Yes    Social History:  reports that he has never smoked. He has never used smokeless tobacco. He reports that he drinks alcohol. He reports that he does not use illicit drugs.  Allergies  Allergen Reactions  . Seasonal Ic [Cholestatin]     Medications: Prior to Admission:  (Not in a hospital  admission)   ROS: Out of a complete 14 system review, the patient complains of only the following symptoms, and all other reviewed systems are negative. +confusion and agitation  Physical Examination: Filed Vitals:   09/18/14 1746  BP: 162/64  Pulse: 79  Temp: 98.2 F (36.8 C)  Resp: 19   Physical Exam  Constitutional: He appears well-developed and well-nourished.  Psych: Affect appropriate to situation Eyes: No scleral injection HENT: No OP obstrucion Head: Normocephalic.  Cardiovascular: Normal rate and regular rhythm.  Respiratory: Effort normal and breath sounds normal.  GI: Soft. Bowel sounds are normal. No distension. There is no tenderness.  Skin: WDI  Neurologic Examination Mental Status: Eyes open, agitated, appears restless in bed. Will state his name and his wife's name but not date or location. Intermittently following commands. Limited verbal output, appears to make sense. Mild dysarthria noted.  Cranial Nerves: II: blinks to threat bilaterally. Unable to visualize fundi bilaterally due to pupil size.pupils equal, round, reactive to light  III,IV, VI: ptosis not present, extra-ocular motions intact bilaterally V,VII: smile symmetric, facial light touch sensation normal bilaterally VIII: hearing normal bilaterally IX,X: cough present XI: unable to formally test XII: tongue strength normal  Motor: Unable to formally test due to mental status but appears to move all extremities symmetrically and against light resistance Sensory: Pinprick and light touch intact throughout, bilaterally Deep Tendon Reflexes: 1+ and symmetric throughout Plantars: Right: downgoing   Left: downgoing Cerebellar: Unable to test due to mental status Gait:  unable to test due to mental status Laboratory Studies:   Basic Metabolic Panel:  Recent Labs Lab 09/18/14 1817  NA 140  K 3.7  CL 110  CO2 19  GLUCOSE 94  BUN 15  CREATININE 0.90  CALCIUM 9.3    Liver Function  Tests: No results for input(s): AST, ALT, ALKPHOS, BILITOT, PROT, ALBUMIN in the last 168 hours. No results for input(s): LIPASE, AMYLASE in the last 168 hours. No results for input(s): AMMONIA in the last 168 hours.  CBC:  Recent Labs Lab 09/18/14 1817  WBC 8.2  NEUTROABS 5.2  HGB 16.9  HCT 48.7  MCV 89.5  PLT 177    Cardiac Enzymes:  Recent Labs Lab 09/18/14 1817  TROPONINI <0.03    BNP: Invalid input(s): POCBNP  CBG: No results for input(s): GLUCAP in the last 168 hours.  Microbiology: No results found for this or any previous visit.  Coagulation Studies: No results for input(s): LABPROT, INR in the last 72 hours.  Urinalysis:  Recent Labs Lab 09/18/14 1824  COLORURINE AMBER*  LABSPEC 1.022  PHURINE 8.0  GLUCOSEU NEGATIVE  HGBUR LARGE*  BILIRUBINUR NEGATIVE  KETONESUR 15*  PROTEINUR 30*  UROBILINOGEN 1.0  NITRITE NEGATIVE  LEUKOCYTESUR SMALL*    Lipid Panel:  No results found for: CHOL, TRIG, HDL, CHOLHDL, VLDL, LDLCALC  HgbA1C: No results found for: HGBA1C  Urine Drug Screen:  No results found for: LABOPIA, COCAINSCRNUR, LABBENZ, AMPHETMU, THCU, LABBARB  Alcohol Level: No results for input(s): ETH in the last 168 hours.  Other results:  Imaging: Ct Head Wo Contrast  09/18/2014   CLINICAL DATA:  Altered mental status, no known injury,  EXAM: CT HEAD WITHOUT CONTRAST  TECHNIQUE: Contiguous axial images were obtained from the base of the skull through the vertex without intravenous contrast.  COMPARISON:  Brain MRI 12/12/2013  FINDINGS: Study is suboptimal due to motion artifacts. Again noted encephalomalacia bilateral frontal lobe. Stable cerebral atrophy and chronic white matter disease. There is encephalomalacia in left occipital region. Ventricular size is stable from prior exam. No intracranial hemorrhage or mass effect. No midline shift. No definite acute cortical infarction. No mass lesion is noted on this unenhanced scan. No skull fracture  is noted. Paranasal sinuses are unremarkable.  IMPRESSION: Suboptimal exam due to motion artifacts. Stable atrophy and chronic white matter disease. No skull fracture is noted. Paranasal sinuses are unremarkable. Stable encephalomalacia bilateral frontal lobe and left occipital lobe. No definite acute cortical infarction with above limitation.   Electronically Signed   By: Natasha MeadLiviu  Pop M.D.   On: 09/18/2014 19:43     Assessment/Plan:  80y/o gentleman with history of dementia presenting for evaluation of acute worsening of confusion and agitation. Would consider a metabolic or infectious etiology of his encephalopathy. UA shows possible UTI. Ischemic infarct is in the differential though less likely based on lack of focal findings on exam. Seizure and post-ictal confusion is also a possibility though less likely based on history.  -check MRI brain -check B12, TSH -UTI treatment per primary team -can consider EEG if above workup unremarkable and mental status not improving -consider low dose seroquel prn for increased agitation  Elspeth ChoPeter Malisha Mabey, DO [facility] [phone]  If 7pm- 7am, please page neurology on call as listed in AMION. 09/18/2014, 7:55 PM

## 2014-09-18 NOTE — Telephone Encounter (Signed)
Patient's spouse stated spouse has been agitated, confused, and fustrated for about an hour.  Patient not making any sense.  Patient slept most of the day.  Able to move limbs and responds back to questions.  Patient will not keep seated and keeps pulling at his clothes.   Please and advise.

## 2014-09-18 NOTE — ED Notes (Signed)
Per EMS- Patient's wife reports that she does not know if patient took all of his meds today or took the wrong medication. Patient's wife reports increased confusion. Patient's wife also stated to EMS that she could not handle him at home tonight. Stroke scale negative.

## 2014-09-18 NOTE — Progress Notes (Signed)
Patient arrived from ED via stretcher, patient is alert and oriented to self only, can state name but unable to state DOB. Patient is anxious and fidgeting at this time. Bed alarm in use. Unable to complete admission record due to patient unable and no family at bedside at this time.

## 2014-09-18 NOTE — ED Provider Notes (Signed)
CSN: [id number]     Arrival date & time 09/18/14  1739 History   First MD Initiated Contact with Patient 09/18/14 1755     Chief Complaint  Patient presents with  . Altered Mental Status      HPI  Patient presents for evaluation. Community by his wife. He comes by EMS. History of frontal lobe dementia with CT showing encephalopathy. Thought to be somewhat multifactorial but perhaps with the December any injury. Progressive decline over several weeks with wife noticed increasing episodes and confusional state and agitation. However this is been intermittent. Seen by neurology only one week ago. Her states that she started the process for consideration for placement in assisted living. He is in his normal state of health yesterday. This morning he slept almost noon. Current pressure time. A little. Laid back down for 30 minutes. Wife awakened him he was immediately agitated.  This is unusual for him. She states she is unable to understand his speech since he awakened. He is Oncologist. Moving all 4 extremities without obvious deficits.  Past Medical History  Diagnosis Date  . Occlusion and stenosis of carotid artery without mention of cerebral infarction   . Impaired fasting glucose   . Other and unspecified hyperlipidemia   . Obstructive sleep apnea (adult) (pediatric)   . Chronic kidney disease, stage II (mild)   . Osteoarthrosis, unspecified whether generalized or localized, lower leg   . Allergic rhinitis, cause unspecified   . Esophageal reflux   . Unspecified vitamin D deficiency   . Anxiety state, unspecified   . Depressive disorder, not elsewhere classified   . 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 Memory loss 08/28/2013   Past Surgical History  Procedure Laterality Date  . Carotid endarterectomy Right   . Inguinal hernia repair  2011  . Turp vaporization     Family History  Problem Relation Age of Onset  . Family history unknown: Yes   History  Substance Use Topics  .  Smoking status: Never Smoker   . Smokeless tobacco: Never Used  . Alcohol Use: Yes     Comment: occas wine    Review of Systems  Unable to perform ROS: Mental status change      Allergies  Seasonal ic  Home Medications   Prior to Admission medications   Medication Sig Start Date End Date Taking? Authorizing Provider  aspirin 81 MG tablet Take 81 mg by mouth at bedtime.    Yes Historical Provider, MD  cholecalciferol (VITAMIN D) 1000 UNITS tablet Take 2,000 Units by mouth at bedtime.    Yes Historical Provider, MD  Glycerin, Laxative, (ADULT SUPPOSITORY RE) Place 1 applicator rectally daily as needed (constipation).   Yes Historical Provider, MD  memantine (NAMENDA) 10 MG tablet Take 1 tablet (10 mg total) by mouth 2 (two) times daily. 04/19/14  Yes Huston Foley, MD  pravastatin (PRAVACHOL) 20 MG tablet Take 20 mg by mouth at bedtime.  07/28/14  Yes Historical Provider, MD  traMADol (ULTRAM) 50 MG tablet Take 1 tablet by mouth 2 (two) times daily as needed for moderate pain or severe pain (pain).  05/06/13  Yes Historical Provider, MD  venlafaxine XR (EFFEXOR-XR) 75 MG 24 hr capsule Take 150 mg by mouth daily with breakfast.    Yes Historical Provider, MD  Acetaminophen (TYLENOL EXTRA STRENGTH PO) Take 500 mg by mouth 3 (three) times daily as needed.     Historical Provider, MD   BP 164/70 mmHg  Pulse 87  Temp(Src)  98.6 F (37 C) (Oral)  Resp 18  SpO2 97% Physical Exam  Constitutional: He appears well-developed and well-nourished. No distress.  HENT:  Head: Normocephalic.  Eyes: Conjunctivae are normal. Pupils are equal, round, and reactive to light. No scleral icterus.  Neck: Normal range of motion. Neck supple. No thyromegaly present.  Cardiovascular: Normal rate and regular rhythm.  Exam reveals no gallop and no friction rub.   No murmur heard. Pulmonary/Chest: Effort normal and breath sounds normal. No respiratory distress. He has no wheezes. He has no rales.  Abdominal:  Soft. Bowel sounds are normal. He exhibits no distension. There is no tenderness. There is no rebound.  Musculoskeletal: Normal range of motion.  Neurological: He is alert.  Diffuse. And comfortable speech. At times will repeat "I gotta get up". Otherwise sounds but no comprehensible principal words.  Skin: Skin is warm and dry. No rash noted.  Psychiatric: He has a normal mood and affect. His behavior is normal.    ED Course  Procedures (including critical care time) Labs Review Labs Reviewed  BASIC METABOLIC PANEL - Abnormal; Notable for the following:    GFR calc non Af Amer 74 (*)    GFR calc Af Amer 86 (*)    All other components within normal limits  URINALYSIS, ROUTINE W REFLEX MICROSCOPIC - Abnormal; Notable for the following:    Color, Urine AMBER (*)    APPearance CLOUDY (*)    Hgb urine dipstick LARGE (*)    Ketones, ur 15 (*)    Protein, ur 30 (*)    Leukocytes, UA SMALL (*)    All other components within normal limits  URINE CULTURE  CBC WITH DIFFERENTIAL  TROPONIN I  URINE MICROSCOPIC-ADD ON    Imaging Review Ct Head Wo Contrast  09/18/2014   CLINICAL DATA:  Altered mental status, no known injury,  EXAM: CT HEAD WITHOUT CONTRAST  TECHNIQUE: Contiguous axial images were obtained from the base of the skull through the vertex without intravenous contrast.  COMPARISON:  Brain MRI 12/12/2013  FINDINGS: Study is suboptimal due to motion artifacts. Again noted encephalomalacia bilateral frontal lobe. Stable cerebral atrophy and chronic white matter disease. There is encephalomalacia in left occipital region. Ventricular size is stable from prior exam. No intracranial hemorrhage or mass effect. No midline shift. No definite acute cortical infarction. No mass lesion is noted on this unenhanced scan. No skull fracture is noted. Paranasal sinuses are unremarkable.  IMPRESSION: Suboptimal exam due to motion artifacts. Stable atrophy and chronic white matter disease. No skull  fracture is noted. Paranasal sinuses are unremarkable. Stable encephalomalacia bilateral frontal lobe and left occipital lobe. No definite acute cortical infarction with above limitation.   Electronically Signed   By: Natasha MeadLiviu  Pop M.D.   On: 09/18/2014 19:43     EKG Interpretation   Date/Time:  Tuesday September 18 2014 18:21:27 EST Ventricular Rate:  88 PR Interval:  163 QRS Duration: 141 QT Interval:  427 QTC Calculation: 517 R Axis:   88 Text Interpretation:  Sinus rhythm Consider left atrial enlargement IVCD,  consider atypical RBBB No change vs 09-13-2008 Confirmed by Fayrene FearingJAMES  MD,  Anneth Brunell ([id number]) on 09/18/2014 7:21:35 PM      MDM   Final diagnoses:  Confusion  Encephalopathy acute  Dementia, with behavioral disturbance  UTI (lower urinary tract infection)    Pt seen by neurology.  CT (limited) shows no acute abnormalities.  Patient given IV Rocephin. Been given IV Ativan and a criminal diagnosis. Given  IM Haldol. More calm. The admission.    Rolland PorterMark Kmari Brian, MD 09/18/14 2034

## 2014-09-18 NOTE — H&P (Signed)
[facility] History and Physical  Allen F Molson Coors BrewingHowle Jr. ZOX:[id number] DOB: Mar 09, 1925 DOA: 09/18/2014  Referring physician: Rolland PorterMark James, MD PCP: Thayer HeadingsMACKENZIE,BRIAN, MD   Chief Complaint: Altered Mental Status  HPI: Allen ChestnutRichard F Constante Jr. is a 80 y.o. male presents with altered mental status. There is no family available at this time. Patient has baseline dementia and a history of stroke in the past. He was brought in by his wife for increased confusion. Patient had also become a little more combative. Patient had been talking but not making any sense. Patients wife stated that he had taken a fall 2 weeks ago on his head and was not seen for this. Patient did have a CT scan here with a lot of motion artifact and there does not appear to be any acute bleed or stroke noted. Neurology was also asked to see the patient and has suggested an MRI and labs. In addition he has been noted to have a UTI and this may explain his increased confusion.   Review of Systems:  Patient is not able to provide a ROS  Past Medical History  Diagnosis Date  . Occlusion and stenosis of carotid artery without mention of cerebral infarction   . Impaired fasting glucose   . Other and unspecified hyperlipidemia   . Obstructive sleep apnea (adult) (pediatric)   . Chronic kidney disease, stage II (mild)   . Osteoarthrosis, unspecified whether generalized or localized, lower leg   . Allergic rhinitis, cause unspecified   . Esophageal reflux   . Unspecified vitamin D deficiency   . Anxiety state, unspecified   . Depressive disorder, not elsewhere classified   . 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 Memory loss 08/28/2013   Past Surgical History  Procedure Laterality Date  . Carotid endarterectomy Right   . Inguinal hernia repair  2011  . Turp vaporization     Social History:  reports that he has never smoked. He has never used smokeless tobacco. He reports that he drinks alcohol. He reports that he does not  use illicit drugs.  Allergies  Allergen Reactions  . Seasonal Ic [Cholestatin]     Family History  Problem Relation Age of Onset  . Family history unknown: Yes     Prior to Admission medications   Medication Sig Start Date End Date Taking? Authorizing Provider  aspirin 81 MG tablet Take 81 mg by mouth at bedtime.    Yes Historical Provider, MD  cholecalciferol (VITAMIN D) 1000 UNITS tablet Take 2,000 Units by mouth at bedtime.    Yes Historical Provider, MD  Glycerin, Laxative, (ADULT SUPPOSITORY RE) Place 1 applicator rectally daily as needed (constipation).   Yes Historical Provider, MD  memantine (NAMENDA) 10 MG tablet Take 1 tablet (10 mg total) by mouth 2 (two) times daily. 04/19/14  Yes Huston FoleySaima Athar, MD  pravastatin (PRAVACHOL) 20 MG tablet Take 20 mg by mouth at bedtime.  07/28/14  Yes Historical Provider, MD  traMADol (ULTRAM) 50 MG tablet Take 1 tablet by mouth 2 (two) times daily as needed for moderate pain or severe pain (pain).  05/06/13  Yes Historical Provider, MD  venlafaxine XR (EFFEXOR-XR) 75 MG 24 hr capsule Take 150 mg by mouth daily with breakfast.    Yes Historical Provider, MD  Acetaminophen (TYLENOL EXTRA STRENGTH PO) Take 500 mg by mouth 3 (three) times daily as needed.     Historical Provider, MD   Physical Exam: Filed Vitals:   09/18/14 1742 09/18/14 1746 09/18/14 2023  BP:  162/64 164/70  Pulse:  79 87  Temp:  98.2 F (36.8 C) 98.6 F (37 C)  TempSrc:  Oral Oral  Resp:  19 18  SpO2: 98% 100% 97%    Wt Readings from Last 3 Encounters:  09/05/14 93.441 kg (206 lb)  04/19/14 92.987 kg (205 lb)  11/30/13 91.627 kg (202 lb)    General:  Agitated at times Eyes: normal lids, irises & conjunctiva ENT: opens eys but not following commands Neck: no LAD, masses or thyromegaly Cardiovascular: RRR, no m/r/g. No LE edema. Respiratory: CTA bilaterally, no w/r/r. Normal respiratory effort. Abdomen: soft, ntnd Skin: no rash or induration seen on limited  exam Musculoskeletal: grossly normal tone BUE/BLE Psychiatric: confused Neurologic: unable to assess.          Labs on Admission:  Basic Metabolic Panel:  Recent Labs Lab 09/18/14 1817  NA 140  K 3.7  CL 110  CO2 19  GLUCOSE 94  BUN 15  CREATININE 0.90  CALCIUM 9.3   Liver Function Tests: No results for input(s): AST, ALT, ALKPHOS, BILITOT, PROT, ALBUMIN in the last 168 hours. No results for input(s): LIPASE, AMYLASE in the last 168 hours. No results for input(s): AMMONIA in the last 168 hours. CBC:  Recent Labs Lab 09/18/14 1817  WBC 8.2  NEUTROABS 5.2  HGB 16.9  HCT 48.7  MCV 89.5  PLT 177   Cardiac Enzymes:  Recent Labs Lab 09/18/14 1817  TROPONINI <0.03    BNP (last 3 results) No results for input(s): PROBNP in the last 8760 hours. CBG: No results for input(s): GLUCAP in the last 168 hours.  Radiological Exams on Admission: Ct Head Wo Contrast  09/18/2014   CLINICAL DATA:  Altered mental status, no known injury,  EXAM: CT HEAD WITHOUT CONTRAST  TECHNIQUE: Contiguous axial images were obtained from the base of the skull through the vertex without intravenous contrast.  COMPARISON:  Brain MRI 12/12/2013  FINDINGS: Study is suboptimal due to motion artifacts. Again noted encephalomalacia bilateral frontal lobe. Stable cerebral atrophy and chronic white matter disease. There is encephalomalacia in left occipital region. Ventricular size is stable from prior exam. No intracranial hemorrhage or mass effect. No midline shift. No definite acute cortical infarction. No mass lesion is noted on this unenhanced scan. No skull fracture is noted. Paranasal sinuses are unremarkable.  IMPRESSION: Suboptimal exam due to motion artifacts. Stable atrophy and chronic white matter disease. No skull fracture is noted. Paranasal sinuses are unremarkable. Stable encephalomalacia bilateral frontal lobe and left occipital lobe. No definite acute cortical infarction with above  limitation.   Electronically Signed   By: Natasha MeadLiviu  Pop M.D.   On: 09/18/2014 19:43      Assessment/Plan Principal Problem:   UTI (lower urinary tract infection) Active Problems:   Altered mental status   Dementia   Encephalopathy   1. UTI -patient will be started on rocephin now -follow up on cultures -hydrate gently  2. Altered Mental Status -neurology consult noted -suggested MRI -B12 TSH ordered -continue with home medications -likely not a stroke but MRI to be done  3. Dementia -more confused likely related to acute infection -will monitor  4. Encephalopathy -as per neurology -consider EEG   Code Status: Full Code (must indicate code status--if unknown or must be presumed, indicate so) DVT Prophylaxis:Heparin Family Communication: none(indicate person spoken with, if applicable, with phone number if by telephone) Disposition Plan: HOme (indicate anticipated LOS)  Time spent:2060min  [facility] IncKHAN,Diavian Furgason A [facility] Pager [phone]

## 2014-09-18 NOTE — Telephone Encounter (Signed)
Acute confusion/agitation can be due to several issues: Could be dehydration, electrolyte disturbance, an infection such as a urinary tract infection. Probably best to have him checked out by his primary care physician.

## 2014-09-18 NOTE — ED Notes (Signed)
Bed: [id number] Expected date:  Expected time:  Means of arrival:  Comments: EMS- 80yo M, increased confusion x 3 hrs

## 2014-09-18 NOTE — Progress Notes (Signed)
ANTIBIOTIC CONSULT NOTE - INITIAL  Pharmacy Consult for Rocephin Indication: UTI  Allergies  Allergen Reactions  . Seasonal Ic [Cholestatin]     Patient Measurements: Height: 6' (182.9 cm) Weight: 195 lb 8.8 oz (88.7 kg) IBW/kg (Calculated) : 77.6   Vital Signs: Temp: 98.2 F (36.8 C) (12/22 2150) Temp Source: Axillary (12/22 2150) BP: 173/73 mmHg (12/22 2150) Pulse Rate: 108 (12/22 2150) Intake/Output from previous day:   Intake/Output from this shift:    Labs:  Recent Labs  09/18/14 1817  WBC 8.2  HGB 16.9  PLT 177  CREATININE 0.90   Estimated Creatinine Clearance: 63.5 mL/min (by C-G formula based on Cr of 0.9). No results for input(s): VANCOTROUGH, VANCOPEAK, VANCORANDOM, GENTTROUGH, GENTPEAK, GENTRANDOM, TOBRATROUGH, TOBRAPEAK, TOBRARND, AMIKACINPEAK, AMIKACINTROU, AMIKACIN in the last 72 hours.   Microbiology: No results found for this or any previous visit (from the past 720 hour(s)).  Medical History: Past Medical History  Diagnosis Date  . Occlusion and stenosis of carotid artery without mention of cerebral infarction   . Impaired fasting glucose   . Other and unspecified hyperlipidemia   . Obstructive sleep apnea (adult) (pediatric)   . Chronic kidney disease, stage II (mild)   . Osteoarthrosis, unspecified whether generalized or localized, lower leg   . Allergic rhinitis, cause unspecified   . Esophageal reflux   . Unspecified vitamin D deficiency   . Anxiety state, unspecified   . Depressive disorder, not elsewhere classified   . 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 Memory loss 08/28/2013    Medications:  Scheduled:  . [START ON 09/19/2014] aspirin EC  81 mg Oral QHS  . [START ON 09/19/2014] cefTRIAXone (ROCEPHIN)  IV  1 g Intravenous Q24H  . [START ON 09/19/2014] cholecalciferol  2,000 Units Oral Daily  . [START ON 09/19/2014] docusate sodium  100 mg Oral BID  . [START ON 09/19/2014] folic acid  1 mg Oral Daily  . [START ON  09/19/2014] heparin  5,000 Units Subcutaneous 3 times per day  . memantine  10 mg Oral BID  . [START ON 09/19/2014] multivitamin with minerals  1 tablet Oral Daily  . [START ON 09/19/2014] pravastatin  20 mg Oral Daily  . [START ON 09/19/2014] thiamine  100 mg Oral Daily  . [START ON 09/19/2014] venlafaxine XR  150 mg Oral Q breakfast   Infusions:  . sodium chloride 50 mL/hr at 09/18/14 2230   Assessment: 89 yoM with altered mental status found to have UTI.  Rocephin per Rx for UTI.   Goal of Therapy:  Treat infection   Plan:   Rocephin 1gm IV q24h for UTI.  F/U cultures as needed  Lorenza EvangelistGreen, Legacie Dillingham R 09/18/2014,10:58 PM

## 2014-09-18 NOTE — Progress Notes (Signed)
[facility] patient order for RN stroke swallow screen which can not be performed on the floor. Per [facility], RN she will assess patient for ability to do screen and perform if able.

## 2014-09-18 NOTE — Telephone Encounter (Signed)
EMS is there and should they take him, can't get him to sit still, BP is normal, no signs of a stroke. Per Dr Frances FurbishAthar[facility]wife verbalized understanding,said ok

## 2014-09-19 ENCOUNTER — Inpatient Hospital Stay (HOSPITAL_COMMUNITY): Payer: Medicare Other

## 2014-09-19 DIAGNOSIS — G9340 Encephalopathy, unspecified: Secondary | ICD-10-CM | POA: Diagnosis present

## 2014-09-19 LAB — CBC
HEMATOCRIT: 46.2 % (ref 39.0–52.0)
HEMOGLOBIN: 15 g/dL (ref 13.0–17.0)
MCH: 29.3 pg (ref 26.0–34.0)
MCHC: 32.5 g/dL (ref 30.0–36.0)
MCV: 90.2 fL (ref 78.0–100.0)
Platelets: 180 10*3/uL (ref 150–400)
RBC: 5.12 MIL/uL (ref 4.22–5.81)
RDW: 13.9 % (ref 11.5–15.5)
WBC: 7 10*3/uL (ref 4.0–10.5)

## 2014-09-19 LAB — COMPREHENSIVE METABOLIC PANEL
ALBUMIN: 3.8 g/dL (ref 3.5–5.2)
ALK PHOS: 61 U/L (ref 39–117)
ALT: 20 U/L (ref 0–53)
AST: 25 U/L (ref 0–37)
Anion gap: 7 (ref 5–15)
BUN: 13 mg/dL (ref 6–23)
CO2: 22 mmol/L (ref 19–32)
Calcium: 8.5 mg/dL (ref 8.4–10.5)
Chloride: 110 mEq/L (ref 96–112)
Creatinine, Ser: 0.78 mg/dL (ref 0.50–1.35)
GFR calc Af Amer: >90 mL/min (ref >90)
GFR calc non Af Amer: 79 mL/min — ABNORMAL LOW (ref >90)
Glucose, Bld: 87 mg/dL (ref 70–99)
POTASSIUM: 3.5 mmol/L (ref 3.5–5.1)
Sodium: 139 mmol/L (ref 135–145)
TOTAL PROTEIN: 6.7 g/dL (ref 6.0–8.3)
Total Bilirubin: 0.8 mg/dL (ref 0.3–1.2)

## 2014-09-19 LAB — TSH: TSH: 1.688 u[IU]/mL (ref 0.350–4.500)

## 2014-09-19 LAB — GLUCOSE, CAPILLARY: GLUCOSE-CAPILLARY: 92 mg/dL (ref 70–99)

## 2014-09-19 LAB — VITAMIN B12: Vitamin B-12: >2000 pg/mL — ABNORMAL HIGH (ref 211–911)

## 2014-09-19 LAB — HEMOGLOBIN A1C
HEMOGLOBIN A1C: 5.9 % — AB (ref <5.7)
MEAN PLASMA GLUCOSE: 123 mg/dL — AB (ref <117)

## 2014-09-19 MED ORDER — DIPHENHYDRAMINE HCL 50 MG/ML IJ SOLN
25.0000 mg | Freq: Once | INTRAMUSCULAR | Status: AC
  Administered 2014-09-19: 25 mg via INTRAVENOUS
  Filled 2014-09-19: qty 1

## 2014-09-19 MED ORDER — PREDNISONE 50 MG PO TABS
60.0000 mg | ORAL_TABLET | Freq: Every day | ORAL | Status: DC
  Administered 2014-09-19 – 2014-09-20 (×2): 60 mg via ORAL
  Filled 2014-09-19 (×3): qty 1

## 2014-09-19 MED ORDER — TUBERCULIN PPD 5 UNIT/0.1ML ID SOLN
5.0000 [IU] | Freq: Once | INTRADERMAL | Status: DC
  Administered 2014-09-19: 5 [IU] via INTRADERMAL
  Filled 2014-09-19: qty 0.1

## 2014-09-19 MED ORDER — GADOBENATE DIMEGLUMINE 529 MG/ML IV SOLN
18.0000 mL | Freq: Once | INTRAVENOUS | Status: AC | PRN
  Administered 2014-09-19: 18 mL via INTRAVENOUS

## 2014-09-19 NOTE — Progress Notes (Signed)
PT Cancellation Note  Patient Details Name: Allen ChestnutRichard F Zimmermann Jr. MRN: [id number] DOB: May 10, 1925   Cancelled Treatment:    Reason Eval/Treat Not Completed: Patient at procedure or test/unavailable. Pt out of room. Will check back another day. Thanks.    Rebeca AlertJannie Mitcheal Sweetin, MPT Pager: [phone]

## 2014-09-19 NOTE — Progress Notes (Signed)
Subjective: Patient is experienced a marked improvement in mental status since admission. He is more awake and less confused. No focal deficits noted.  Objective: Current vital signs: BP 149/66 mmHg  Pulse 80  Temp(Src) 98.3 F (36.8 C) (Oral)  Resp 18  Ht 6' (1.829 m)  Wt 88.7 kg (195 lb 8.8 oz)  BMI 26.52 kg/m2  SpO2 97%  Neurologic Exam: Patient was alert and in no acute distress. He was well-oriented to time as well as place. His extraocular movements were full and conjugate. Face was symmetrical with no weakness. Speech was normal. Patient moved extremities equally with no signs of focal weakness.  Medications: I have reviewed the patient's current medications.  Assessment/Plan: 80 year old man noted with marked confusion and cephalopathic state, likely secondary to acute infectious process changes UTI. Patient has markedly improved on antibiotic therapy since admission. No focal deficits noted.  No changes in current management recommended. Patient is scheduled for an EEG study later today. If EEG is unremarkable, no further neurological intervention is indicated.  C.R. Roseanne RenoStewart, MD [facility] [phone]  09/19/2014  10:36 AM

## 2014-09-19 NOTE — Progress Notes (Signed)
Pt noted to have new rash on back and chest with drainage.  Pt states that he is not itchy or SOB.  Flaky, dried areas/patches noted on arms and legs.  Pt received IV dye at 1457.  MD paged, orders received. Margarethe Virgen A

## 2014-09-19 NOTE — Progress Notes (Signed)
Clinical Social Work Department BRIEF PSYCHOSOCIAL ASSESSMENT 09/19/2014  Patient:  Allen Vega,Allen Vega     Account Number:  [id number]     Admit date:  09/18/2014  Clinical Social Worker:  Orpah GreekFOLEY,Madaline Lefeber, LCSWA  Date/Time:  09/19/2014 10:23 AM  Referred by:  Physician  Date Referred:  09/19/2014 Referred for  ALF Placement   Other Referral:   Interview type:  Family Other interview type:   patient's wife, Allen Vega at bedside    PSYCHOSOCIAL DATA Living Status:  WIFE Admitted from facility:   Level of care:   Primary support name:  Allen Vega (wife) h#: [phone] c#: [phone] Primary support relationship to patient:  SPOUSE Degree of support available:   good    CURRENT CONCERNS Current Concerns  Post-Acute Placement   Other Concerns:    SOCIAL WORK ASSESSMENT / PLAN CSW received consult from RN that patient is for ALF placement.   Assessment/plan status:  [facility] Other assessment/ plan:   Information/referral to community resources:   CSW completed FL[facility] and [facility] wife's [facility].    PATIENT'S/FAMILY'S RESPONSE TO PLAN OF CARE: CSW spoke with patient's wife who informed CSW that she had made arrangements prior to hospitalization, [facility]. Wife already has keys and access to the room and would be able to move him in whenever - CSW encouraged wife to go ahead and make arrangements to have the bed delivered and everything so that patient could just be admitted to ALF when he is discharged from the hospital.         Lincoln MaxinKelly Damisha Wolff, LCSW [facility] Clinical Social Worker cell #: [phone]

## 2014-09-19 NOTE — Progress Notes (Signed)
Patient unable to void in urinal x2, bladder appears distended, bladder scan reveals 520 mL. Dr. Welton FlakesKhan paged as there is no order for in and out cath. Also informed him of patient needing to be in and out cathed in ED as well. Awaiting call back.

## 2014-09-19 NOTE — Progress Notes (Signed)
[facility] PROGRESS NOTE  Allen Vega. RUE:[id number] DOB: 1925/09/25 DOA: 09/18/2014 PCP: Thayer HeadingsMACKENZIE,BRIAN, MD  Assessment/Plan: 1. UTI -patient will be started on rocephin now -follow up on cultures -hydrate gently  2. Altered Mental Status resolved this am.  -neurology consult noted. MRI negative for acute stroke.  -B12 TSH ordered -continue with home medications   3. Dementia -more confused likely related to acute infection -will monitor  4. Encephalopathy -as per neurology -consider EEG  Code Status: full code.  Family Communication: none at bedside Disposition Plan: pending.    Consultants:  Neurology.  Procedures:  MRI brain shows areas of chronic hemorrhage.   Antibiotics: Rocephin  HPI/Subjective: Comfortable. Denies any new complaints.   Objective: Filed Vitals:   09/19/14 1336  BP: 123/40  Pulse: 79  Temp: 98.1 F (36.7 C)  Resp: 18    Intake/Output Summary (Last 24 hours) at 09/19/14 1548 Last data filed at 09/19/14 1340  Gross per 24 hour  Intake    425 ml  Output   1075 ml  Net   -650 ml   Filed Weights   09/18/14 2150  Weight: 88.7 kg (195 lb 8.8 oz)    Exam:   General:  Alert and oriented  Cardiovascular: s1s2  Respiratory: ctab  Abdomen: soft NT ND BS+  Musculoskeletal: no pedal edema.   Data Reviewed: Basic Metabolic Panel:  Recent Labs Lab 09/18/14 1817 09/19/14 0412  NA 140 139  K 3.7 3.5  CL 110 110  CO2 19 22  GLUCOSE 94 87  BUN 15 13  CREATININE 0.90 0.78  CALCIUM 9.3 8.5   Liver Function Tests:  Recent Labs Lab 09/19/14 0412  AST 25  ALT 20  ALKPHOS 61  BILITOT 0.8  PROT 6.7  ALBUMIN 3.8   No results for input(s): LIPASE, AMYLASE in the last 168 hours. No results for input(s): AMMONIA in the last 168 hours. CBC:  Recent Labs Lab 09/18/14 1817 09/19/14 0412  WBC 8.2 7.0  NEUTROABS 5.2  --   HGB 16.9 15.0  HCT 48.7 46.2  MCV 89.5 90.2  PLT 177 180   Cardiac  Enzymes:  Recent Labs Lab 09/18/14 1817  TROPONINI <0.03   BNP (last 3 results) No results for input(s): PROBNP in the last 8760 hours. CBG:  Recent Labs Lab 09/19/14 0743  GLUCAP 92    No results found for this or any previous visit (from the past 240 hour(s)).   Studies: Ct Head Wo Contrast  09/18/2014   CLINICAL DATA:  Altered mental status, no known injury,  EXAM: CT HEAD WITHOUT CONTRAST  TECHNIQUE: Contiguous axial images were obtained from the base of the skull through the vertex without intravenous contrast.  COMPARISON:  Brain MRI 12/12/2013  FINDINGS: Study is suboptimal due to motion artifacts. Again noted encephalomalacia bilateral frontal lobe. Stable cerebral atrophy and chronic white matter disease. There is encephalomalacia in left occipital region. Ventricular size is stable from prior exam. No intracranial hemorrhage or mass effect. No midline shift. No definite acute cortical infarction. No mass lesion is noted on this unenhanced scan. No skull fracture is noted. Paranasal sinuses are unremarkable.  IMPRESSION: Suboptimal exam due to motion artifacts. Stable atrophy and chronic white matter disease. No skull fracture is noted. Paranasal sinuses are unremarkable. Stable encephalomalacia bilateral frontal lobe and left occipital lobe. No definite acute cortical infarction with above limitation.   Electronically Signed   By: Natasha MeadLiviu  Pop M.D.   On: 09/18/2014 19:43  [name]  09/19/2014   CLINICAL DATA:  Altered mental status.  Dementia  EXAM: MRI HEAD WITHOUT AND WITH CONTRAST  TECHNIQUE: Multiplanar, multiecho pulse sequences of the brain and surrounding structures were obtained without and with intravenous contrast.  CONTRAST:  18mL MULTIHANCE GADOBENATE DIMEGLUMINE 529 MG/ML IV SOLN  COMPARISON:  CT 09/18/2014, MRI 12/12/2013  FINDINGS: Image quality degraded by motion. Multiple attempts were performed and fast scanning was performed to minimize motion.   Moderate atrophy. Moderate to advanced chronic microvascular ischemic changes throughout the white matter.  Negative for acute infarct.  Numerous areas of chronic hemorrhage in the brain including the left occipital parietal lobe and right frontal lobe. Superficial siderosis is present with hemosiderin on the surface of the brain diffusely as noted previously. Small focus of hyperintensity on diffusion-weighted imaging in a sulcus over the convexity on the left likely is an area of hemorrhage which may have occurred in the interval. This was not identified on the prior study. This is not identified as high density on the recent CT scan from yesterday.  Negative for mass or edema.  No shift of the midline structures.  Normal enhancement following contrast administration.  Paranasal sinuses clear.  IMPRESSION: Image quality degraded by motion  Moderate to advanced atrophy and chronic microvascular ischemia. No acute ischemic infarct.  Numerous areas of chronic hemorrhage in the brain including chronic superficial siderosis. Possible recent small area of hemorrhage over the convexity on the left . Multiple areas of hemorrhage may be related to chronic hypertension however cerebral amyloid and hemorrhagic infarction are other considerations.   Electronically Signed   By: Marlan Palauharles  Clark M.D.   On: 09/19/2014 15:31    Scheduled Meds: . aspirin EC  81 mg Oral QHS  . cefTRIAXone (ROCEPHIN)  IV  1 g Intravenous Q24H  . cholecalciferol  2,000 Units Oral Daily  . diphenhydrAMINE  25 mg Intravenous Once  . docusate sodium  100 mg Oral BID  . folic acid  1 mg Oral Daily  . heparin  5,000 Units Subcutaneous 3 times per day  . memantine  10 mg Oral BID  . multivitamin with minerals  1 tablet Oral Daily  . pravastatin  20 mg Oral Daily  . predniSONE  60 mg Oral Q breakfast  . thiamine  100 mg Oral Daily  . venlafaxine XR  150 mg Oral Q breakfast   Continuous Infusions: . sodium chloride 50 mL/hr at 09/18/14 2230     Principal Problem:   UTI (lower urinary tract infection) Active Problems:   Altered mental status   Dementia   Encephalopathy   Acute encephalopathy    Time spent: 25 min    Allen Vega  [facility] Pager [phone] If 7PM-7AM, please contact night-coverage at [email or web address], password [email or web address] 09/19/2014, 3:48 PM  LOS: 1 day

## 2014-09-20 LAB — URINE CULTURE
COLONY COUNT: NO GROWTH
Culture: NO GROWTH

## 2014-09-20 LAB — GLUCOSE, CAPILLARY: Glucose-Capillary: 105 mg/dL — ABNORMAL HIGH (ref 70–99)

## 2014-09-20 MED ORDER — THIAMINE HCL 100 MG PO TABS: 100.0000 mg | ORAL_TABLET | Freq: Every day | ORAL | Status: DC

## 2014-09-20 MED ORDER — FOLIC ACID 1 MG PO TABS: 1.0000 mg | ORAL_TABLET | Freq: Every day | ORAL | Status: DC

## 2014-09-20 MED ORDER — TRAMADOL HCL 50 MG PO TABS: 50.0000 mg | ORAL_TABLET | Freq: Two times a day (BID) | ORAL | Status: DC | PRN

## 2014-09-20 NOTE — Discharge Summary (Addendum)
Physician Discharge Summary  Allen Chestnutichard F Bartleson Jr. [id number] DOB: 09/13/1925 DOA: 09/18/2014  PCP: Thayer HeadingsMACKENZIE,BRIAN, MD  Admit date: 09/18/2014 Discharge date: 09/20/2014  Time spent: 30 minutes  Recommendations for Outpatient Follow-up:  1. Follow up with PCP in one week.  2. Follow u with neurology as recommended.   Discharge Diagnoses:  Principal Problem:   UTI (lower urinary tract infection) Active Problems:   Altered mental status   Dementia   Encephalopathy   Acute encephalopathy   Discharge Condition: improved.   Diet recommendation: regular  Filed Weights   09/18/14 2150  Weight: 88.7 kg (195 lb 8.8 oz)    History of present illness:  Allen ChestnutRichard F Ghee Jr. is a 80 y.o. male presents with altered mental status  Hospital Course:  1. UTI Urine cultures negative.   2. Altered Mental Status resolved this am. -neurology consult noted. MRI negative for acute stroke.  -continue with home medications   3. Dementia -more confused likely related to acute infection Outpatient follow up with neurology.   4. Encephalopathy -as per neurology -consider EEG as outpatient if it reoccurs.    5.  Acute urinary retention: 2 times in and out cath done and finally a foley had to be placed. Will try voiding trial and if not successful, will discharge patient to SNF with foley and outpatient follow up with urology.    Procedures:  MRI brain with out contrast  Consultations:  neurology  Discharge Exam: Filed Vitals:   09/20/14 0604  BP: 144/79  Pulse: 94  Temp: 98.2 F (36.8 C)  Resp: 18    General: alert afebrile comfortable Cardiovascular: s1s2 Respiratory: ctab  Discharge Instructions    Current Discharge Medication List    START taking these medications   Details  folic acid (FOLVITE) 1 MG tablet Take 1 tablet (1 mg total) by mouth daily.    thiamine 100 MG tablet Take 1 tablet (100 mg total) by mouth daily.      CONTINUE these  medications which have NOT CHANGED   Details  aspirin 81 MG tablet Take 81 mg by mouth at bedtime.     cholecalciferol (VITAMIN D) 1000 UNITS tablet Take 2,000 Units by mouth at bedtime.     Glycerin, Laxative, (ADULT SUPPOSITORY RE) Place 1 applicator rectally daily as needed (constipation).    memantine (NAMENDA) 10 MG tablet Take 1 tablet (10 mg total) by mouth 2 (two) times daily. Qty: 60 tablet, Refills: 5   Associated Diagnoses: Dementia with behavioral disturbance    pravastatin (PRAVACHOL) 20 MG tablet Take 20 mg by mouth at bedtime.  Refills: 1    traMADol (ULTRAM) 50 MG tablet Take 1 tablet by mouth 2 (two) times daily as needed for moderate pain or severe pain (pain).     venlafaxine XR (EFFEXOR-XR) 75 MG 24 hr capsule Take 150 mg by mouth daily with breakfast.     Acetaminophen (TYLENOL EXTRA STRENGTH PO) Take 500 mg by mouth 3 (three) times daily as needed.        Allergies  Allergen Reactions  . Gadobenate Dimeglumine [Gadobenate] Rash  . Seasonal Ic [Cholestatin]       The results of significant diagnostics from this hospitalization (including imaging, microbiology, ancillary and laboratory) are listed below for reference.    Significant Diagnostic Studies: Ct Head Wo Contrast  09/18/2014   CLINICAL DATA:  Altered mental status, no known injury,  EXAM: CT HEAD WITHOUT CONTRAST  TECHNIQUE: Contiguous axial images were obtained from the base  of the skull through the vertex without intravenous contrast.  COMPARISON:  Brain MRI 12/12/2013  FINDINGS: Study is suboptimal due to motion artifacts. Again noted encephalomalacia bilateral frontal lobe. Stable cerebral atrophy and chronic white matter disease. There is encephalomalacia in left occipital region. Ventricular size is stable from prior exam. No intracranial hemorrhage or mass effect. No midline shift. No definite acute cortical infarction. No mass lesion is noted on this unenhanced scan. No skull fracture is noted.  Paranasal sinuses are unremarkable.  IMPRESSION: Suboptimal exam due to motion artifacts. Stable atrophy and chronic white matter disease. No skull fracture is noted. Paranasal sinuses are unremarkable. Stable encephalomalacia bilateral frontal lobe and left occipital lobe. No definite acute cortical infarction with above limitation.   Electronically Signed   By: Natasha Mead M.D.   On: 09/18/2014 19:43   Mr Allen Vega Contrast  09/19/2014   CLINICAL DATA:  Altered mental status.  Dementia  EXAM: MRI HEAD WITHOUT AND WITH CONTRAST  TECHNIQUE: Multiplanar, multiecho pulse sequences of the brain and surrounding structures were obtained without and with intravenous contrast.  CONTRAST:  18mL MULTIHANCE GADOBENATE DIMEGLUMINE 529 MG/ML IV SOLN  COMPARISON:  CT 09/18/2014, MRI 12/12/2013  FINDINGS: Image quality degraded by motion. Multiple attempts were performed and fast scanning was performed to minimize motion.  Moderate atrophy. Moderate to advanced chronic microvascular ischemic changes throughout the white matter.  Negative for acute infarct.  Numerous areas of chronic hemorrhage in the brain including the left occipital parietal lobe and right frontal lobe. Superficial siderosis is present with hemosiderin on the surface of the brain diffusely as noted previously. Small focus of hyperintensity on diffusion-weighted imaging in a sulcus over the convexity on the left likely is an area of hemorrhage which may have occurred in the interval. This was not identified on the prior study. This is not identified as high density on the recent CT scan from yesterday.  Negative for mass or edema.  No shift of the midline structures.  Normal enhancement following contrast administration.  Paranasal sinuses clear.  IMPRESSION: Image quality degraded by motion  Moderate to advanced atrophy and chronic microvascular ischemia. No acute ischemic infarct.  Numerous areas of chronic hemorrhage in the brain including chronic  superficial siderosis. Possible recent small area of hemorrhage over the convexity on the left . Multiple areas of hemorrhage may be related to chronic hypertension however cerebral amyloid and hemorrhagic infarction are other considerations.   Electronically Signed   By: Marlan Palau M.D.   On: 09/19/2014 15:31    Microbiology: Recent Results (from the past 240 hour(s))  Urine culture     Status: None   Collection Time: 09/18/14  7:18 PM  Result Value Ref Range Status   Specimen Description URINE, CLEAN CATCH  Final   Special Requests NONE  Final   Culture  Setup Time   Final    09/19/2014 03:24 Performed at [facility]    Colony Count NO GROWTH Performed at [facility]   Final   Culture NO GROWTH Performed at [facility]   Final   Report Status 09/20/2014 FINAL  Final     Labs: Basic Metabolic Panel:  Recent Labs Lab 09/18/14 1817 09/19/14 0412  NA 140 139  K 3.7 3.5  CL 110 110  CO2 19 22  GLUCOSE 94 87  BUN 15 13  CREATININE 0.90 0.78  CALCIUM 9.3 8.5   Liver Function Tests:  Recent Labs Lab 09/19/14 [phone]  AST 25  ALT 20  ALKPHOS 61  BILITOT 0.8  PROT 6.7  ALBUMIN 3.8   No results for input(s): LIPASE, AMYLASE in the last 168 hours. No results for input(s): AMMONIA in the last 168 hours. CBC:  Recent Labs Lab 09/18/14 1817 09/19/14 0412  WBC 8.2 7.0  NEUTROABS 5.2  --   HGB 16.9 15.0  HCT 48.7 46.2  MCV 89.5 90.2  PLT 177 180   Cardiac Enzymes:  Recent Labs Lab 09/18/14 1817  TROPONINI <0.03   BNP: BNP (last 3 results) No results for input(s): PROBNP in the last 8760 hours. CBG:  Recent Labs Lab 09/19/14 0743 09/20/14 0732  GLUCAP 92 105*       Signed:  Amanee Iacovelli  [facility] 09/20/2014, 11:13 AM  Addendum: Patient 's urine cultures have been negative, decided not to prescribe antibiotics on discharge. [facility], he will go great at  ALF.    Kathlen ModyVijaya Kemora Pinard, MD

## 2014-09-20 NOTE — Progress Notes (Signed)
Patient is set to [facility]. Patient & wife, Bosie ClosJudith aware. Discharge packet given to RN, Amil AmenJulia. Wife to transport to ALF.     Allen MaxinKelly Lucila Klecka, LCSW [facility] Clinical Social Worker cell #: [phone]

## 2014-09-20 NOTE — Evaluation (Signed)
Physical Therapy Evaluation Patient Details Name: Allen Vega. MRN: [id number] DOB: 01-02-1925 Today's Date: 09/20/2014   History of Present Illness  Allen Vega. is a 80 y.o. male presents with altered mental status. There is no family available at this time. Patient has baseline dementia and a history of stroke in the past. He was brought in by his wife for increased confusion. Patient had also become a little more combative. Patient had been talking but not making any sense. Patients wife stated that he had taken a fall 2 weeks ago on his head and was not seen for this. Patient did have a CT scan here with a lot of motion artifact and there does not appear to be any acute bleed or stroke noted. Neurology was also asked to see the patient and has suggested an MRI and labs. In addition he has been noted to have a UTI and this may explain his increased confusion.  Clinical Impression  **Pt admitted with above diagnosis. Pt currently with functional limitations due to the deficits listed below (see PT Problem List). ** Pt will benefit from skilled PT to increase their independence and safety with mobility to allow discharge to the venue listed below.    Pt ambulated 200' with RW with min/guard to min A for balance as he has occasional  Unsteadiness. Supervision recommended for mobility.  *    Follow Up Recommendations Home health PT    Equipment Recommendations  Rolling walker with 5" wheels    Recommendations for Other Services       Precautions / Restrictions Precautions Precautions: Fall Restrictions Weight Bearing Restrictions: No      Mobility  Bed Mobility Overal bed mobility: Independent                Transfers Overall transfer level: Needs assistance Equipment used: Rolling walker (2 wheeled) Transfers: Sit to/from Stand Sit to Stand: Min assist         General transfer comment: for balance  Ambulation/Gait Ambulation/Gait assistance: Min  assist;Min guard Ambulation Distance (Feet): 200 Feet Assistive device: Rolling walker (2 wheeled) Gait Pattern/deviations: WFL(Within Functional Limits)   Gait velocity interpretation: at or above normal speed for age/gender General Gait Details: min a for balance  Stairs            Wheelchair Mobility    Modified Rankin (Stroke Patients Only)       Balance Overall balance assessment: Needs assistance Sitting-balance support: No upper extremity supported;Feet supported Sitting balance-Leahy Scale: Good       Standing balance-Leahy Scale: Fair                               Pertinent Vitals/Pain Pain Assessment: No/denies pain    Home Living Family/patient expects to be discharged to:: Assisted living               Home Equipment: Kasandra Knudsen - single point      Prior Function Level of Independence: Independent with assistive device(s)               Hand Dominance        Extremity/Trunk Assessment   Upper Extremity Assessment: Overall WFL for tasks assessed           Lower Extremity Assessment: Overall WFL for tasks assessed      Cervical / Trunk Assessment: Normal  Communication   Communication: No difficulties  Cognition Arousal/Alertness: Awake/alert  Behavior During Therapy: Impulsive Overall Cognitive Status: Impaired/Different from baseline (h/o dementia, more confusion on admission, no family present now) Area of Impairment: Safety/judgement                    General Comments      Exercises        Assessment/Plan    PT Assessment All further PT needs can be met in the next venue of care  PT Diagnosis Difficulty walking   PT Problem List Decreased balance;Decreased safety awareness  PT Treatment Interventions     PT Goals (Current goals can be found in the Care Plan section) Acute Rehab PT Goals Patient Stated Goal: to find a bed for his room at ALF    Frequency     Barriers to discharge         Co-evaluation               End of Session Equipment Utilized During Treatment: Gait belt Activity Tolerance: Patient tolerated treatment well Patient left: in chair;with call bell/phone within reach;with chair alarm set Nurse Communication: Mobility status         Time: 1041-1100 PT Time Calculation (min) (ACUTE ONLY): 19 min   Charges:   PT Evaluation $Initial PT Evaluation Tier I: 1 Procedure PT Treatments $Gait Training: 8-22 mins   PT G Codes:        Philomena Doheny 09/20/2014, 11:54 AM [phone]

## 2014-09-20 NOTE — Progress Notes (Signed)
CARE MANAGEMENT NOTE 09/20/2014  Patient:  Herold HarmsHOWLE,Traver F   Account Number:  [id number]  Date Initiated:  09/20/2014  Documentation initiated by:  Trinna BalloonMcGIBBONEY,COOKIE Kataleena Holsapple  Subjective/Objective Assessment:   PT ADMITTED WITH UTI     Action/Plan:   MEMORY CARE/[facility]   Anticipated DC Date:  09/20/2014   Anticipated DC Plan:  ASSISTED LIVING / REST HOME  In-house referral  Clinical Social Worker      DC Planning Services  CM consult      Choice offered to / List presented to:             Status of service:  In process, will continue to follow Medicare Important Message given?   (If response is "NO", the following Medicare IM given date fields will be blank) Date Medicare IM given:   Medicare IM given by:   Date Additional Medicare IM given:   Additional Medicare IM given by:    Discharge Disposition:    Per UR Regulation:  Reviewed for med. necessity/level of care/duration of stay  If discussed at Long Length of Stay Meetings, dates discussed:    Comments:  09/20/14 MMcGibboney, RN, BSN Chart reviewed.  Pt discharging to Memory Care.

## 2014-09-21 NOTE — Progress Notes (Addendum)
Pt's wife called to floor concerned that her husband was not on an antibiotic at discharge when he was diagnosed with a UTI. Dr. Blake DivineAkula was the discharging MD and she was in the hospital today. She was contacted and she reviewed the pt's chart and stated that the urine culture had come back with no growth and so she had decided not to prescribe an antibiotic at discharge. Dr. Blake DivineAkula stated that she would call pt's wife to discuss this with her.Arta Bruceeutsch, Trisa [facility] LP[facility]BRULER

## 2014-09-30 DIAGNOSIS — I10 Essential (primary) hypertension: Secondary | ICD-10-CM | POA: Diagnosis not present

## 2014-09-30 DIAGNOSIS — R6889 Other general symptoms and signs: Secondary | ICD-10-CM | POA: Diagnosis not present

## 2014-10-02 DIAGNOSIS — E538 Deficiency of other specified B group vitamins: Secondary | ICD-10-CM | POA: Diagnosis not present

## 2014-10-02 DIAGNOSIS — E039 Hypothyroidism, unspecified: Secondary | ICD-10-CM | POA: Diagnosis not present

## 2014-10-02 DIAGNOSIS — E559 Vitamin D deficiency, unspecified: Secondary | ICD-10-CM | POA: Diagnosis not present

## 2014-10-02 DIAGNOSIS — E1121 Type 2 diabetes mellitus with diabetic nephropathy: Secondary | ICD-10-CM | POA: Diagnosis not present

## 2014-10-02 DIAGNOSIS — E785 Hyperlipidemia, unspecified: Secondary | ICD-10-CM | POA: Diagnosis not present

## 2014-10-03 ENCOUNTER — Other Ambulatory Visit: Payer: Self-pay | Admitting: Neurology

## 2014-10-03 DIAGNOSIS — F03918 Unspecified dementia, unspecified severity, with other behavioral disturbance: Secondary | ICD-10-CM

## 2014-10-03 DIAGNOSIS — R269 Unspecified abnormalities of gait and mobility: Secondary | ICD-10-CM

## 2014-10-03 DIAGNOSIS — F0391 Unspecified dementia with behavioral disturbance: Secondary | ICD-10-CM

## 2014-10-03 DIAGNOSIS — R296 Repeated falls: Secondary | ICD-10-CM

## 2014-10-05 NOTE — Progress Notes (Signed)
[facility] see epic [facility] notes

## 2014-10-12 DIAGNOSIS — R262 Difficulty in walking, not elsewhere classified: Secondary | ICD-10-CM | POA: Diagnosis not present

## 2014-10-12 DIAGNOSIS — Z9181 History of falling: Secondary | ICD-10-CM | POA: Diagnosis not present

## 2014-10-12 DIAGNOSIS — M6281 Muscle weakness (generalized): Secondary | ICD-10-CM | POA: Diagnosis not present

## 2014-10-15 DIAGNOSIS — R262 Difficulty in walking, not elsewhere classified: Secondary | ICD-10-CM | POA: Diagnosis not present

## 2014-10-15 DIAGNOSIS — M6281 Muscle weakness (generalized): Secondary | ICD-10-CM | POA: Diagnosis not present

## 2014-10-15 DIAGNOSIS — Z9181 History of falling: Secondary | ICD-10-CM | POA: Diagnosis not present

## 2014-10-17 DIAGNOSIS — M6281 Muscle weakness (generalized): Secondary | ICD-10-CM | POA: Diagnosis not present

## 2014-10-17 DIAGNOSIS — R262 Difficulty in walking, not elsewhere classified: Secondary | ICD-10-CM | POA: Diagnosis not present

## 2014-10-17 DIAGNOSIS — Z9181 History of falling: Secondary | ICD-10-CM | POA: Diagnosis not present

## 2014-10-22 DIAGNOSIS — Z9181 History of falling: Secondary | ICD-10-CM | POA: Diagnosis not present

## 2014-10-22 DIAGNOSIS — M6281 Muscle weakness (generalized): Secondary | ICD-10-CM | POA: Diagnosis not present

## 2014-10-22 DIAGNOSIS — R262 Difficulty in walking, not elsewhere classified: Secondary | ICD-10-CM | POA: Diagnosis not present

## 2014-10-24 DIAGNOSIS — Z9181 History of falling: Secondary | ICD-10-CM | POA: Diagnosis not present

## 2014-10-24 DIAGNOSIS — R262 Difficulty in walking, not elsewhere classified: Secondary | ICD-10-CM | POA: Diagnosis not present

## 2014-10-24 DIAGNOSIS — M6281 Muscle weakness (generalized): Secondary | ICD-10-CM | POA: Diagnosis not present

## 2014-10-25 ENCOUNTER — Ambulatory Visit: Payer: Medicare Other | Admitting: Neurology

## 2014-10-26 DIAGNOSIS — Z9181 History of falling: Secondary | ICD-10-CM | POA: Diagnosis not present

## 2014-10-26 DIAGNOSIS — M6281 Muscle weakness (generalized): Secondary | ICD-10-CM | POA: Diagnosis not present

## 2014-10-26 DIAGNOSIS — R262 Difficulty in walking, not elsewhere classified: Secondary | ICD-10-CM | POA: Diagnosis not present

## 2014-10-29 DIAGNOSIS — M6281 Muscle weakness (generalized): Secondary | ICD-10-CM | POA: Diagnosis not present

## 2014-10-29 DIAGNOSIS — R262 Difficulty in walking, not elsewhere classified: Secondary | ICD-10-CM | POA: Diagnosis not present

## 2014-10-29 DIAGNOSIS — Z9181 History of falling: Secondary | ICD-10-CM | POA: Diagnosis not present

## 2014-10-30 DIAGNOSIS — G301 Alzheimer's disease with late onset: Secondary | ICD-10-CM | POA: Diagnosis not present

## 2014-10-30 DIAGNOSIS — I129 Hypertensive chronic kidney disease with stage 1 through stage 4 chronic kidney disease, or unspecified chronic kidney disease: Secondary | ICD-10-CM | POA: Diagnosis not present

## 2014-10-30 DIAGNOSIS — N182 Chronic kidney disease, stage 2 (mild): Secondary | ICD-10-CM | POA: Diagnosis not present

## 2014-10-30 DIAGNOSIS — E559 Vitamin D deficiency, unspecified: Secondary | ICD-10-CM | POA: Diagnosis not present

## 2014-10-30 DIAGNOSIS — E8881 Metabolic syndrome: Secondary | ICD-10-CM | POA: Diagnosis not present

## 2014-10-31 DIAGNOSIS — R262 Difficulty in walking, not elsewhere classified: Secondary | ICD-10-CM | POA: Diagnosis not present

## 2014-10-31 DIAGNOSIS — M6281 Muscle weakness (generalized): Secondary | ICD-10-CM | POA: Diagnosis not present

## 2014-10-31 DIAGNOSIS — Z9181 History of falling: Secondary | ICD-10-CM | POA: Diagnosis not present

## 2014-11-02 DIAGNOSIS — Z9181 History of falling: Secondary | ICD-10-CM | POA: Diagnosis not present

## 2014-11-02 DIAGNOSIS — M6281 Muscle weakness (generalized): Secondary | ICD-10-CM | POA: Diagnosis not present

## 2014-11-02 DIAGNOSIS — R262 Difficulty in walking, not elsewhere classified: Secondary | ICD-10-CM | POA: Diagnosis not present

## 2014-11-06 DIAGNOSIS — Z9181 History of falling: Secondary | ICD-10-CM | POA: Diagnosis not present

## 2014-11-06 DIAGNOSIS — M6281 Muscle weakness (generalized): Secondary | ICD-10-CM | POA: Diagnosis not present

## 2014-11-06 DIAGNOSIS — R262 Difficulty in walking, not elsewhere classified: Secondary | ICD-10-CM | POA: Diagnosis not present

## 2014-11-06 DIAGNOSIS — N183 Chronic kidney disease, stage 3 (moderate): Secondary | ICD-10-CM | POA: Diagnosis not present

## 2014-11-07 DIAGNOSIS — Z9181 History of falling: Secondary | ICD-10-CM | POA: Diagnosis not present

## 2014-11-07 DIAGNOSIS — R262 Difficulty in walking, not elsewhere classified: Secondary | ICD-10-CM | POA: Diagnosis not present

## 2014-11-07 DIAGNOSIS — M6281 Muscle weakness (generalized): Secondary | ICD-10-CM | POA: Diagnosis not present

## 2014-11-09 DIAGNOSIS — Z9181 History of falling: Secondary | ICD-10-CM | POA: Diagnosis not present

## 2014-11-09 DIAGNOSIS — R262 Difficulty in walking, not elsewhere classified: Secondary | ICD-10-CM | POA: Diagnosis not present

## 2014-11-09 DIAGNOSIS — M6281 Muscle weakness (generalized): Secondary | ICD-10-CM | POA: Diagnosis not present

## 2014-11-12 DIAGNOSIS — Z9181 History of falling: Secondary | ICD-10-CM | POA: Diagnosis not present

## 2014-11-12 DIAGNOSIS — R262 Difficulty in walking, not elsewhere classified: Secondary | ICD-10-CM | POA: Diagnosis not present

## 2014-11-12 DIAGNOSIS — M6281 Muscle weakness (generalized): Secondary | ICD-10-CM | POA: Diagnosis not present

## 2014-11-14 DIAGNOSIS — R262 Difficulty in walking, not elsewhere classified: Secondary | ICD-10-CM | POA: Diagnosis not present

## 2014-11-14 DIAGNOSIS — Z9181 History of falling: Secondary | ICD-10-CM | POA: Diagnosis not present

## 2014-11-14 DIAGNOSIS — M6281 Muscle weakness (generalized): Secondary | ICD-10-CM | POA: Diagnosis not present

## 2014-11-16 DIAGNOSIS — Z9181 History of falling: Secondary | ICD-10-CM | POA: Diagnosis not present

## 2014-11-16 DIAGNOSIS — R262 Difficulty in walking, not elsewhere classified: Secondary | ICD-10-CM | POA: Diagnosis not present

## 2014-11-16 DIAGNOSIS — M6281 Muscle weakness (generalized): Secondary | ICD-10-CM | POA: Diagnosis not present

## 2014-11-19 DIAGNOSIS — M6281 Muscle weakness (generalized): Secondary | ICD-10-CM | POA: Diagnosis not present

## 2014-11-19 DIAGNOSIS — R262 Difficulty in walking, not elsewhere classified: Secondary | ICD-10-CM | POA: Diagnosis not present

## 2014-11-19 DIAGNOSIS — Z9181 History of falling: Secondary | ICD-10-CM | POA: Diagnosis not present

## 2014-11-19 DIAGNOSIS — N390 Urinary tract infection, site not specified: Secondary | ICD-10-CM | POA: Diagnosis not present

## 2014-11-22 DIAGNOSIS — M6281 Muscle weakness (generalized): Secondary | ICD-10-CM | POA: Diagnosis not present

## 2014-11-22 DIAGNOSIS — R262 Difficulty in walking, not elsewhere classified: Secondary | ICD-10-CM | POA: Diagnosis not present

## 2014-11-22 DIAGNOSIS — Z9181 History of falling: Secondary | ICD-10-CM | POA: Diagnosis not present

## 2014-11-23 DIAGNOSIS — R262 Difficulty in walking, not elsewhere classified: Secondary | ICD-10-CM | POA: Diagnosis not present

## 2014-11-23 DIAGNOSIS — Z9181 History of falling: Secondary | ICD-10-CM | POA: Diagnosis not present

## 2014-11-23 DIAGNOSIS — M6281 Muscle weakness (generalized): Secondary | ICD-10-CM | POA: Diagnosis not present

## 2014-11-27 DIAGNOSIS — R262 Difficulty in walking, not elsewhere classified: Secondary | ICD-10-CM | POA: Diagnosis not present

## 2014-11-27 DIAGNOSIS — Z9181 History of falling: Secondary | ICD-10-CM | POA: Diagnosis not present

## 2014-11-27 DIAGNOSIS — M6281 Muscle weakness (generalized): Secondary | ICD-10-CM | POA: Diagnosis not present

## 2014-11-28 DIAGNOSIS — Z9181 History of falling: Secondary | ICD-10-CM | POA: Diagnosis not present

## 2014-11-28 DIAGNOSIS — M6281 Muscle weakness (generalized): Secondary | ICD-10-CM | POA: Diagnosis not present

## 2014-11-28 DIAGNOSIS — R262 Difficulty in walking, not elsewhere classified: Secondary | ICD-10-CM | POA: Diagnosis not present

## 2014-11-29 DIAGNOSIS — Z9181 History of falling: Secondary | ICD-10-CM | POA: Diagnosis not present

## 2014-11-29 DIAGNOSIS — M6281 Muscle weakness (generalized): Secondary | ICD-10-CM | POA: Diagnosis not present

## 2014-11-29 DIAGNOSIS — R262 Difficulty in walking, not elsewhere classified: Secondary | ICD-10-CM | POA: Diagnosis not present

## 2014-12-03 DIAGNOSIS — N390 Urinary tract infection, site not specified: Secondary | ICD-10-CM | POA: Diagnosis not present

## 2014-12-04 DIAGNOSIS — M6281 Muscle weakness (generalized): Secondary | ICD-10-CM | POA: Diagnosis not present

## 2014-12-04 DIAGNOSIS — R262 Difficulty in walking, not elsewhere classified: Secondary | ICD-10-CM | POA: Diagnosis not present

## 2014-12-04 DIAGNOSIS — Z9181 History of falling: Secondary | ICD-10-CM | POA: Diagnosis not present

## 2014-12-18 ENCOUNTER — Ambulatory Visit (INDEPENDENT_AMBULATORY_CARE_PROVIDER_SITE_OTHER): Payer: Medicare Other | Admitting: Neurology

## 2014-12-18 ENCOUNTER — Encounter: Payer: Self-pay | Admitting: Neurology

## 2014-12-18 VITALS — BP 131/60 | HR 76 | Temp 97.0°F | Resp 16 | Ht 72.0 in | Wt 205.0 lb

## 2014-12-18 DIAGNOSIS — R269 Unspecified abnormalities of gait and mobility: Secondary | ICD-10-CM

## 2014-12-18 DIAGNOSIS — F0391 Unspecified dementia with behavioral disturbance: Secondary | ICD-10-CM | POA: Diagnosis not present

## 2014-12-18 DIAGNOSIS — F03918 Unspecified dementia, unspecified severity, with other behavioral disturbance: Secondary | ICD-10-CM

## 2014-12-18 DIAGNOSIS — R296 Repeated falls: Secondary | ICD-10-CM | POA: Diagnosis not present

## 2014-12-18 NOTE — Patient Instructions (Signed)
We will continue with your namenda 10 mg twice daily.  I am pleased with how you are doing.  You have adjusted well to your new place of living! Drink more water.  Follow up in 4-5 months.
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
## 2014-12-25 DIAGNOSIS — G4733 Obstructive sleep apnea (adult) (pediatric): Secondary | ICD-10-CM | POA: Diagnosis not present

## 2014-12-25 DIAGNOSIS — M1991 Primary osteoarthritis, unspecified site: Secondary | ICD-10-CM | POA: Diagnosis not present

## 2014-12-25 DIAGNOSIS — K5901 Slow transit constipation: Secondary | ICD-10-CM | POA: Diagnosis not present

## 2014-12-25 DIAGNOSIS — F411 Generalized anxiety disorder: Secondary | ICD-10-CM | POA: Diagnosis not present

## 2015-01-22 DIAGNOSIS — F411 Generalized anxiety disorder: Secondary | ICD-10-CM | POA: Diagnosis not present

## 2015-01-22 DIAGNOSIS — F0281 Dementia in other diseases classified elsewhere with behavioral disturbance: Secondary | ICD-10-CM | POA: Diagnosis not present

## 2015-01-22 DIAGNOSIS — I129 Hypertensive chronic kidney disease with stage 1 through stage 4 chronic kidney disease, or unspecified chronic kidney disease: Secondary | ICD-10-CM | POA: Diagnosis not present

## 2015-01-22 DIAGNOSIS — G301 Alzheimer's disease with late onset: Secondary | ICD-10-CM | POA: Diagnosis not present

## 2015-01-22 DIAGNOSIS — F331 Major depressive disorder, recurrent, moderate: Secondary | ICD-10-CM | POA: Diagnosis not present

## 2015-03-11 DIAGNOSIS — N390 Urinary tract infection, site not specified: Secondary | ICD-10-CM | POA: Diagnosis not present

## 2015-03-25 ENCOUNTER — Other Ambulatory Visit: Payer: Self-pay

## 2015-04-02 DIAGNOSIS — M1991 Primary osteoarthritis, unspecified site: Secondary | ICD-10-CM | POA: Diagnosis not present

## 2015-04-02 DIAGNOSIS — I129 Hypertensive chronic kidney disease with stage 1 through stage 4 chronic kidney disease, or unspecified chronic kidney disease: Secondary | ICD-10-CM | POA: Diagnosis not present

## 2015-04-02 DIAGNOSIS — K5901 Slow transit constipation: Secondary | ICD-10-CM | POA: Diagnosis not present

## 2015-04-02 DIAGNOSIS — G301 Alzheimer's disease with late onset: Secondary | ICD-10-CM | POA: Diagnosis not present

## 2015-04-02 DIAGNOSIS — F411 Generalized anxiety disorder: Secondary | ICD-10-CM | POA: Diagnosis not present

## 2015-05-06 DIAGNOSIS — F0281 Dementia in other diseases classified elsewhere with behavioral disturbance: Secondary | ICD-10-CM | POA: Diagnosis not present

## 2015-05-06 DIAGNOSIS — H6123 Impacted cerumen, bilateral: Secondary | ICD-10-CM | POA: Diagnosis not present

## 2015-05-06 DIAGNOSIS — H903 Sensorineural hearing loss, bilateral: Secondary | ICD-10-CM | POA: Diagnosis not present

## 2015-05-13 ENCOUNTER — Ambulatory Visit: Payer: Medicare Other | Admitting: Neurology

## 2015-05-21 ENCOUNTER — Ambulatory Visit: Payer: Medicare Other | Admitting: Neurology

## 2015-06-05 DIAGNOSIS — R296 Repeated falls: Secondary | ICD-10-CM | POA: Diagnosis not present

## 2015-06-05 DIAGNOSIS — R2681 Unsteadiness on feet: Secondary | ICD-10-CM | POA: Diagnosis not present

## 2015-06-05 DIAGNOSIS — M6281 Muscle weakness (generalized): Secondary | ICD-10-CM | POA: Diagnosis not present

## 2015-06-06 DIAGNOSIS — M6281 Muscle weakness (generalized): Secondary | ICD-10-CM | POA: Diagnosis not present

## 2015-06-06 DIAGNOSIS — R296 Repeated falls: Secondary | ICD-10-CM | POA: Diagnosis not present

## 2015-06-06 DIAGNOSIS — R2681 Unsteadiness on feet: Secondary | ICD-10-CM | POA: Diagnosis not present

## 2015-06-07 DIAGNOSIS — M6281 Muscle weakness (generalized): Secondary | ICD-10-CM | POA: Diagnosis not present

## 2015-06-07 DIAGNOSIS — R296 Repeated falls: Secondary | ICD-10-CM | POA: Diagnosis not present

## 2015-06-10 DIAGNOSIS — R296 Repeated falls: Secondary | ICD-10-CM | POA: Diagnosis not present

## 2015-06-10 DIAGNOSIS — M6281 Muscle weakness (generalized): Secondary | ICD-10-CM | POA: Diagnosis not present

## 2015-06-10 DIAGNOSIS — R2681 Unsteadiness on feet: Secondary | ICD-10-CM | POA: Diagnosis not present

## 2015-06-11 ENCOUNTER — Ambulatory Visit (INDEPENDENT_AMBULATORY_CARE_PROVIDER_SITE_OTHER): Payer: Medicare Other | Admitting: Neurology

## 2015-06-11 ENCOUNTER — Encounter: Payer: Self-pay | Admitting: Neurology

## 2015-06-11 VITALS — BP 142/66 | HR 62 | Resp 16

## 2015-06-11 DIAGNOSIS — F39 Unspecified mood [affective] disorder: Secondary | ICD-10-CM | POA: Diagnosis not present

## 2015-06-11 DIAGNOSIS — F0391 Unspecified dementia with behavioral disturbance: Secondary | ICD-10-CM

## 2015-06-11 DIAGNOSIS — R296 Repeated falls: Secondary | ICD-10-CM | POA: Diagnosis not present

## 2015-06-11 DIAGNOSIS — F03918 Unspecified dementia, unspecified severity, with other behavioral disturbance: Secondary | ICD-10-CM

## 2015-06-11 DIAGNOSIS — R269 Unspecified abnormalities of gait and mobility: Secondary | ICD-10-CM

## 2015-06-11 DIAGNOSIS — R2681 Unsteadiness on feet: Secondary | ICD-10-CM | POA: Diagnosis not present

## 2015-06-11 DIAGNOSIS — M6281 Muscle weakness (generalized): Secondary | ICD-10-CM | POA: Diagnosis not present

## 2015-06-11 NOTE — Patient Instructions (Addendum)
We have limited options for memory loss. We will continue with your namenda 10 mg twice daily.  Try to participate in exercise classes, and physical therapy.   Please discuss with Dr. Redmond School whether you can increase your Effexor XR to 75 mg plus 37.5 mg.
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
## 2015-06-12 DIAGNOSIS — R2681 Unsteadiness on feet: Secondary | ICD-10-CM | POA: Diagnosis not present

## 2015-06-12 DIAGNOSIS — M6281 Muscle weakness (generalized): Secondary | ICD-10-CM | POA: Diagnosis not present

## 2015-06-12 DIAGNOSIS — R296 Repeated falls: Secondary | ICD-10-CM | POA: Diagnosis not present

## 2015-06-13 DIAGNOSIS — M6281 Muscle weakness (generalized): Secondary | ICD-10-CM | POA: Diagnosis not present

## 2015-06-13 DIAGNOSIS — R2681 Unsteadiness on feet: Secondary | ICD-10-CM | POA: Diagnosis not present

## 2015-06-13 DIAGNOSIS — R296 Repeated falls: Secondary | ICD-10-CM | POA: Diagnosis not present

## 2015-06-14 DIAGNOSIS — R2681 Unsteadiness on feet: Secondary | ICD-10-CM | POA: Diagnosis not present

## 2015-06-14 DIAGNOSIS — M6281 Muscle weakness (generalized): Secondary | ICD-10-CM | POA: Diagnosis not present

## 2015-06-14 DIAGNOSIS — R296 Repeated falls: Secondary | ICD-10-CM | POA: Diagnosis not present

## 2015-06-17 DIAGNOSIS — M6281 Muscle weakness (generalized): Secondary | ICD-10-CM | POA: Diagnosis not present

## 2015-06-17 DIAGNOSIS — R2681 Unsteadiness on feet: Secondary | ICD-10-CM | POA: Diagnosis not present

## 2015-06-17 DIAGNOSIS — R296 Repeated falls: Secondary | ICD-10-CM | POA: Diagnosis not present

## 2015-06-18 DIAGNOSIS — R2681 Unsteadiness on feet: Secondary | ICD-10-CM | POA: Diagnosis not present

## 2015-06-18 DIAGNOSIS — R296 Repeated falls: Secondary | ICD-10-CM | POA: Diagnosis not present

## 2015-06-18 DIAGNOSIS — M6281 Muscle weakness (generalized): Secondary | ICD-10-CM | POA: Diagnosis not present

## 2015-06-19 DIAGNOSIS — R296 Repeated falls: Secondary | ICD-10-CM | POA: Diagnosis not present

## 2015-06-19 DIAGNOSIS — M6281 Muscle weakness (generalized): Secondary | ICD-10-CM | POA: Diagnosis not present

## 2015-06-19 DIAGNOSIS — R2681 Unsteadiness on feet: Secondary | ICD-10-CM | POA: Diagnosis not present

## 2015-06-20 DIAGNOSIS — R296 Repeated falls: Secondary | ICD-10-CM | POA: Diagnosis not present

## 2015-06-20 DIAGNOSIS — R2681 Unsteadiness on feet: Secondary | ICD-10-CM | POA: Diagnosis not present

## 2015-06-20 DIAGNOSIS — M6281 Muscle weakness (generalized): Secondary | ICD-10-CM | POA: Diagnosis not present

## 2015-06-21 DIAGNOSIS — R2681 Unsteadiness on feet: Secondary | ICD-10-CM | POA: Diagnosis not present

## 2015-06-21 DIAGNOSIS — M6281 Muscle weakness (generalized): Secondary | ICD-10-CM | POA: Diagnosis not present

## 2015-06-21 DIAGNOSIS — R296 Repeated falls: Secondary | ICD-10-CM | POA: Diagnosis not present

## 2015-06-24 DIAGNOSIS — R296 Repeated falls: Secondary | ICD-10-CM | POA: Diagnosis not present

## 2015-06-24 DIAGNOSIS — M6281 Muscle weakness (generalized): Secondary | ICD-10-CM | POA: Diagnosis not present

## 2015-06-24 DIAGNOSIS — R2681 Unsteadiness on feet: Secondary | ICD-10-CM | POA: Diagnosis not present

## 2015-06-25 DIAGNOSIS — R296 Repeated falls: Secondary | ICD-10-CM | POA: Diagnosis not present

## 2015-06-25 DIAGNOSIS — M6281 Muscle weakness (generalized): Secondary | ICD-10-CM | POA: Diagnosis not present

## 2015-06-26 DIAGNOSIS — M6281 Muscle weakness (generalized): Secondary | ICD-10-CM | POA: Diagnosis not present

## 2015-06-26 DIAGNOSIS — R296 Repeated falls: Secondary | ICD-10-CM | POA: Diagnosis not present

## 2015-06-26 DIAGNOSIS — R2681 Unsteadiness on feet: Secondary | ICD-10-CM | POA: Diagnosis not present

## 2015-06-27 DIAGNOSIS — R2681 Unsteadiness on feet: Secondary | ICD-10-CM | POA: Diagnosis not present

## 2015-06-27 DIAGNOSIS — R296 Repeated falls: Secondary | ICD-10-CM | POA: Diagnosis not present

## 2015-06-27 DIAGNOSIS — M6281 Muscle weakness (generalized): Secondary | ICD-10-CM | POA: Diagnosis not present

## 2015-06-28 DIAGNOSIS — M6281 Muscle weakness (generalized): Secondary | ICD-10-CM | POA: Diagnosis not present

## 2015-06-28 DIAGNOSIS — R296 Repeated falls: Secondary | ICD-10-CM | POA: Diagnosis not present

## 2015-06-28 DIAGNOSIS — R2681 Unsteadiness on feet: Secondary | ICD-10-CM | POA: Diagnosis not present

## 2015-07-01 DIAGNOSIS — M6281 Muscle weakness (generalized): Secondary | ICD-10-CM | POA: Diagnosis not present

## 2015-07-01 DIAGNOSIS — R2681 Unsteadiness on feet: Secondary | ICD-10-CM | POA: Diagnosis not present

## 2015-07-01 DIAGNOSIS — R296 Repeated falls: Secondary | ICD-10-CM | POA: Diagnosis not present

## 2015-07-02 DIAGNOSIS — R2681 Unsteadiness on feet: Secondary | ICD-10-CM | POA: Diagnosis not present

## 2015-07-02 DIAGNOSIS — M6281 Muscle weakness (generalized): Secondary | ICD-10-CM | POA: Diagnosis not present

## 2015-07-02 DIAGNOSIS — R296 Repeated falls: Secondary | ICD-10-CM | POA: Diagnosis not present

## 2015-07-03 DIAGNOSIS — R296 Repeated falls: Secondary | ICD-10-CM | POA: Diagnosis not present

## 2015-07-03 DIAGNOSIS — R2681 Unsteadiness on feet: Secondary | ICD-10-CM | POA: Diagnosis not present

## 2015-07-03 DIAGNOSIS — M6281 Muscle weakness (generalized): Secondary | ICD-10-CM | POA: Diagnosis not present

## 2015-07-04 DIAGNOSIS — M6281 Muscle weakness (generalized): Secondary | ICD-10-CM | POA: Diagnosis not present

## 2015-07-04 DIAGNOSIS — R296 Repeated falls: Secondary | ICD-10-CM | POA: Diagnosis not present

## 2015-07-04 DIAGNOSIS — R2681 Unsteadiness on feet: Secondary | ICD-10-CM | POA: Diagnosis not present

## 2015-07-05 DIAGNOSIS — R2681 Unsteadiness on feet: Secondary | ICD-10-CM | POA: Diagnosis not present

## 2015-07-05 DIAGNOSIS — R296 Repeated falls: Secondary | ICD-10-CM | POA: Diagnosis not present

## 2015-07-05 DIAGNOSIS — M6281 Muscle weakness (generalized): Secondary | ICD-10-CM | POA: Diagnosis not present

## 2015-07-08 DIAGNOSIS — M6281 Muscle weakness (generalized): Secondary | ICD-10-CM | POA: Diagnosis not present

## 2015-07-08 DIAGNOSIS — R2681 Unsteadiness on feet: Secondary | ICD-10-CM | POA: Diagnosis not present

## 2015-07-08 DIAGNOSIS — R296 Repeated falls: Secondary | ICD-10-CM | POA: Diagnosis not present

## 2015-07-15 DIAGNOSIS — M6281 Muscle weakness (generalized): Secondary | ICD-10-CM | POA: Diagnosis not present

## 2015-07-15 DIAGNOSIS — R2681 Unsteadiness on feet: Secondary | ICD-10-CM | POA: Diagnosis not present

## 2015-07-15 DIAGNOSIS — R296 Repeated falls: Secondary | ICD-10-CM | POA: Diagnosis not present

## 2015-07-17 DIAGNOSIS — R296 Repeated falls: Secondary | ICD-10-CM | POA: Diagnosis not present

## 2015-07-17 DIAGNOSIS — R2681 Unsteadiness on feet: Secondary | ICD-10-CM | POA: Diagnosis not present

## 2015-07-17 DIAGNOSIS — M6281 Muscle weakness (generalized): Secondary | ICD-10-CM | POA: Diagnosis not present

## 2015-07-18 DIAGNOSIS — R2681 Unsteadiness on feet: Secondary | ICD-10-CM | POA: Diagnosis not present

## 2015-07-18 DIAGNOSIS — R296 Repeated falls: Secondary | ICD-10-CM | POA: Diagnosis not present

## 2015-07-18 DIAGNOSIS — M6281 Muscle weakness (generalized): Secondary | ICD-10-CM | POA: Diagnosis not present

## 2015-07-19 DIAGNOSIS — R296 Repeated falls: Secondary | ICD-10-CM | POA: Diagnosis not present

## 2015-07-19 DIAGNOSIS — M6281 Muscle weakness (generalized): Secondary | ICD-10-CM | POA: Diagnosis not present

## 2015-07-19 DIAGNOSIS — R2681 Unsteadiness on feet: Secondary | ICD-10-CM | POA: Diagnosis not present

## 2015-07-23 DIAGNOSIS — R2681 Unsteadiness on feet: Secondary | ICD-10-CM | POA: Diagnosis not present

## 2015-07-23 DIAGNOSIS — R296 Repeated falls: Secondary | ICD-10-CM | POA: Diagnosis not present

## 2015-07-23 DIAGNOSIS — M6281 Muscle weakness (generalized): Secondary | ICD-10-CM | POA: Diagnosis not present

## 2015-07-25 DIAGNOSIS — R2681 Unsteadiness on feet: Secondary | ICD-10-CM | POA: Diagnosis not present

## 2015-07-25 DIAGNOSIS — R296 Repeated falls: Secondary | ICD-10-CM | POA: Diagnosis not present

## 2015-07-25 DIAGNOSIS — M6281 Muscle weakness (generalized): Secondary | ICD-10-CM | POA: Diagnosis not present

## 2015-09-11 DIAGNOSIS — R2681 Unsteadiness on feet: Secondary | ICD-10-CM | POA: Diagnosis not present

## 2015-09-11 DIAGNOSIS — Z9181 History of falling: Secondary | ICD-10-CM | POA: Diagnosis not present

## 2015-09-11 DIAGNOSIS — R262 Difficulty in walking, not elsewhere classified: Secondary | ICD-10-CM | POA: Diagnosis not present

## 2015-09-11 DIAGNOSIS — R29898 Other symptoms and signs involving the musculoskeletal system: Secondary | ICD-10-CM | POA: Diagnosis not present

## 2015-09-11 DIAGNOSIS — R296 Repeated falls: Secondary | ICD-10-CM | POA: Diagnosis not present

## 2015-09-12 DIAGNOSIS — Z9181 History of falling: Secondary | ICD-10-CM | POA: Diagnosis not present

## 2015-09-12 DIAGNOSIS — R296 Repeated falls: Secondary | ICD-10-CM | POA: Diagnosis not present

## 2015-09-12 DIAGNOSIS — R2681 Unsteadiness on feet: Secondary | ICD-10-CM | POA: Diagnosis not present

## 2015-09-12 DIAGNOSIS — R29898 Other symptoms and signs involving the musculoskeletal system: Secondary | ICD-10-CM | POA: Diagnosis not present

## 2015-09-12 DIAGNOSIS — R262 Difficulty in walking, not elsewhere classified: Secondary | ICD-10-CM | POA: Diagnosis not present

## 2015-09-13 DIAGNOSIS — R262 Difficulty in walking, not elsewhere classified: Secondary | ICD-10-CM | POA: Diagnosis not present

## 2015-09-13 DIAGNOSIS — R296 Repeated falls: Secondary | ICD-10-CM | POA: Diagnosis not present

## 2015-09-13 DIAGNOSIS — Z9181 History of falling: Secondary | ICD-10-CM | POA: Diagnosis not present

## 2015-09-13 DIAGNOSIS — R2681 Unsteadiness on feet: Secondary | ICD-10-CM | POA: Diagnosis not present

## 2015-09-13 DIAGNOSIS — R29898 Other symptoms and signs involving the musculoskeletal system: Secondary | ICD-10-CM | POA: Diagnosis not present

## 2015-09-16 DIAGNOSIS — R29898 Other symptoms and signs involving the musculoskeletal system: Secondary | ICD-10-CM | POA: Diagnosis not present

## 2015-09-16 DIAGNOSIS — R262 Difficulty in walking, not elsewhere classified: Secondary | ICD-10-CM | POA: Diagnosis not present

## 2015-09-16 DIAGNOSIS — Z9181 History of falling: Secondary | ICD-10-CM | POA: Diagnosis not present

## 2015-09-16 DIAGNOSIS — R2681 Unsteadiness on feet: Secondary | ICD-10-CM | POA: Diagnosis not present

## 2015-09-16 DIAGNOSIS — R296 Repeated falls: Secondary | ICD-10-CM | POA: Diagnosis not present

## 2015-09-19 DIAGNOSIS — B351 Tinea unguium: Secondary | ICD-10-CM | POA: Diagnosis not present

## 2015-09-19 DIAGNOSIS — L84 Corns and callosities: Secondary | ICD-10-CM | POA: Diagnosis not present

## 2015-09-24 ENCOUNTER — Emergency Department (HOSPITAL_COMMUNITY): Payer: Medicare Other

## 2015-09-24 ENCOUNTER — Encounter (HOSPITAL_COMMUNITY): Payer: Self-pay | Admitting: Family Medicine

## 2015-09-24 ENCOUNTER — Observation Stay (HOSPITAL_COMMUNITY)
Admission: EM | Admit: 2015-09-24 | Discharge: 2015-09-25 | Disposition: A | Discharge status: Home / Self Care | Payer: Medicare Other | Attending: Internal Medicine | Admitting: Internal Medicine

## 2015-09-24 DIAGNOSIS — D696 Thrombocytopenia, unspecified: Secondary | ICD-10-CM | POA: Diagnosis not present

## 2015-09-24 DIAGNOSIS — F329 Major depressive disorder, single episode, unspecified: Secondary | ICD-10-CM | POA: Insufficient documentation

## 2015-09-24 DIAGNOSIS — N390 Urinary tract infection, site not specified: Secondary | ICD-10-CM | POA: Diagnosis not present

## 2015-09-24 DIAGNOSIS — I712 Thoracic aortic aneurysm, without rupture: Secondary | ICD-10-CM | POA: Diagnosis not present

## 2015-09-24 DIAGNOSIS — K219 Gastro-esophageal reflux disease without esophagitis: Secondary | ICD-10-CM | POA: Insufficient documentation

## 2015-09-24 DIAGNOSIS — F419 Anxiety disorder, unspecified: Secondary | ICD-10-CM | POA: Insufficient documentation

## 2015-09-24 DIAGNOSIS — I6529 Occlusion and stenosis of unspecified carotid artery: Secondary | ICD-10-CM | POA: Diagnosis not present

## 2015-09-24 DIAGNOSIS — N3090 Cystitis, unspecified without hematuria: Secondary | ICD-10-CM

## 2015-09-24 DIAGNOSIS — N182 Chronic kidney disease, stage 2 (mild): Secondary | ICD-10-CM | POA: Diagnosis not present

## 2015-09-24 DIAGNOSIS — R509 Fever, unspecified: Secondary | ICD-10-CM | POA: Diagnosis not present

## 2015-09-24 DIAGNOSIS — N200 Calculus of kidney: Secondary | ICD-10-CM

## 2015-09-24 DIAGNOSIS — N201 Calculus of ureter: Secondary | ICD-10-CM | POA: Insufficient documentation

## 2015-09-24 DIAGNOSIS — D649 Anemia, unspecified: Secondary | ICD-10-CM | POA: Insufficient documentation

## 2015-09-24 DIAGNOSIS — F0390 Unspecified dementia without behavioral disturbance: Secondary | ICD-10-CM | POA: Diagnosis not present

## 2015-09-24 DIAGNOSIS — F0391 Unspecified dementia with behavioral disturbance: Secondary | ICD-10-CM | POA: Diagnosis not present

## 2015-09-24 DIAGNOSIS — R4182 Altered mental status, unspecified: Secondary | ICD-10-CM | POA: Diagnosis not present

## 2015-09-24 DIAGNOSIS — F29 Unspecified psychosis not due to a substance or known physiological condition: Secondary | ICD-10-CM | POA: Diagnosis not present

## 2015-09-24 DIAGNOSIS — Z79899 Other long term (current) drug therapy: Secondary | ICD-10-CM | POA: Diagnosis not present

## 2015-09-24 DIAGNOSIS — R5081 Fever presenting with conditions classified elsewhere: Secondary | ICD-10-CM | POA: Diagnosis not present

## 2015-09-24 DIAGNOSIS — E785 Hyperlipidemia, unspecified: Secondary | ICD-10-CM | POA: Diagnosis not present

## 2015-09-24 DIAGNOSIS — Z7982 Long term (current) use of aspirin: Secondary | ICD-10-CM | POA: Insufficient documentation

## 2015-09-24 DIAGNOSIS — Z7951 Long term (current) use of inhaled steroids: Secondary | ICD-10-CM | POA: Insufficient documentation

## 2015-09-24 DIAGNOSIS — B9689 Other specified bacterial agents as the cause of diseases classified elsewhere: Secondary | ICD-10-CM | POA: Diagnosis not present

## 2015-09-24 DIAGNOSIS — E876 Hypokalemia: Secondary | ICD-10-CM | POA: Insufficient documentation

## 2015-09-24 DIAGNOSIS — G4733 Obstructive sleep apnea (adult) (pediatric): Secondary | ICD-10-CM | POA: Diagnosis not present

## 2015-09-24 LAB — CBC WITH DIFFERENTIAL/PLATELET
Basophils Absolute: 0 K/uL (ref 0.0–0.1)
Basophils Relative: 0 %
Eosinophils Absolute: 0.1 K/uL (ref 0.0–0.7)
Eosinophils Relative: 1 %
HCT: 36 % — ABNORMAL LOW (ref 39.0–52.0)
Hemoglobin: 12.4 g/dL — ABNORMAL LOW (ref 13.0–17.0)
Lymphocytes Relative: 14 %
Lymphs Abs: 0.7 K/uL (ref 0.7–4.0)
MCH: 31.5 pg (ref 26.0–34.0)
MCHC: 34.4 g/dL (ref 30.0–36.0)
MCV: 91.4 fL (ref 78.0–100.0)
Monocytes Absolute: 0.8 K/uL (ref 0.1–1.0)
Monocytes Relative: 16 %
Neutro Abs: 3.4 K/uL (ref 1.7–7.7)
Neutrophils Relative %: 68 %
Platelets: 83 K/uL — ABNORMAL LOW (ref 150–400)
RBC: 3.94 MIL/uL — ABNORMAL LOW (ref 4.22–5.81)
RDW: 14.8 % (ref 11.5–15.5)
WBC: 5 K/uL (ref 4.0–10.5)

## 2015-09-24 LAB — COMPREHENSIVE METABOLIC PANEL WITH GFR
ALT: 34 U/L (ref 17–63)
AST: 40 U/L (ref 15–41)
Albumin: 3.2 g/dL — ABNORMAL LOW (ref 3.5–5.0)
Alkaline Phosphatase: 41 U/L (ref 38–126)
Anion gap: 9 (ref 5–15)
BUN: 17 mg/dL (ref 6–20)
CO2: 25 mmol/L (ref 22–32)
Calcium: 8.5 mg/dL — ABNORMAL LOW (ref 8.9–10.3)
Chloride: 112 mmol/L — ABNORMAL HIGH (ref 101–111)
Creatinine, Ser: 0.79 mg/dL (ref 0.61–1.24)
GFR calc Af Amer: >60 mL/min
GFR calc non Af Amer: >60 mL/min
Glucose, Bld: 109 mg/dL — ABNORMAL HIGH (ref 65–99)
Potassium: 3.4 mmol/L — ABNORMAL LOW (ref 3.5–5.1)
Sodium: 146 mmol/L — ABNORMAL HIGH (ref 135–145)
Total Bilirubin: 0.8 mg/dL (ref 0.3–1.2)
Total Protein: 5.9 g/dL — ABNORMAL LOW (ref 6.5–8.1)

## 2015-09-24 LAB — URINE MICROSCOPIC-ADD ON
Bacteria, UA: NONE SEEN
Squamous Epithelial / LPF: NONE SEEN

## 2015-09-24 LAB — URINALYSIS, ROUTINE W REFLEX MICROSCOPIC
Glucose, UA: NEGATIVE mg/dL
Ketones, ur: 15 mg/dL — AB
Leukocytes, UA: NEGATIVE
Nitrite: NEGATIVE
Protein, ur: 30 mg/dL — AB
Specific Gravity, Urine: 1.026 (ref 1.005–1.030)
pH: 6 (ref 5.0–8.0)

## 2015-09-24 LAB — I-STAT CG4 LACTIC ACID, ED: Lactic Acid, Venous: 1.35 mmol/L (ref 0.5–2.0)

## 2015-09-24 LAB — VALPROIC ACID LEVEL: Valproic Acid Lvl: 34 ug/mL — ABNORMAL LOW (ref 50.0–100.0)

## 2015-09-24 MED ORDER — LISINOPRIL 2.5 MG PO TABS
2.5000 mg | ORAL_TABLET | Freq: Every day | ORAL | Status: DC
  Administered 2015-09-25: 2.5 mg via ORAL
  Filled 2015-09-24: qty 1

## 2015-09-24 MED ORDER — VENLAFAXINE HCL ER 37.5 MG PO CP24
37.5000 mg | ORAL_CAPSULE | Freq: Every day | ORAL | Status: DC
  Administered 2015-09-24 – 2015-09-25 (×2): 37.5 mg via ORAL
  Filled 2015-09-24 (×2): qty 1

## 2015-09-24 MED ORDER — SODIUM CHLORIDE 0.9 % IJ SOLN: 3.0000 mL | INTRAMUSCULAR | Status: DC | PRN

## 2015-09-24 MED ORDER — DIVALPROEX SODIUM 250 MG PO DR TAB
375.0000 mg | DELAYED_RELEASE_TABLET | Freq: Two times a day (BID) | ORAL | Status: DC
  Administered 2015-09-24 – 2015-09-25 (×3): 375 mg via ORAL
  Filled 2015-09-24 (×4): qty 1

## 2015-09-24 MED ORDER — ASPIRIN 81 MG PO CHEW
81.0000 mg | CHEWABLE_TABLET | Freq: Every day | ORAL | Status: DC
  Administered 2015-09-24: 81 mg via ORAL
  Filled 2015-09-24 (×2): qty 1

## 2015-09-24 MED ORDER — SODIUM CHLORIDE 0.9 % IV SOLN
250.0000 mL | INTRAVENOUS | Status: DC | PRN
  Administered 2015-09-24: 250 mL via INTRAVENOUS

## 2015-09-24 MED ORDER — SODIUM CHLORIDE 0.9 % IV BOLUS (SEPSIS)
1000.0000 mL | Freq: Once | INTRAVENOUS | Status: AC
  Administered 2015-09-24: 1000 mL via INTRAVENOUS

## 2015-09-24 MED ORDER — MEMANTINE HCL 10 MG PO TABS
10.0000 mg | ORAL_TABLET | Freq: Two times a day (BID) | ORAL | Status: DC
  Administered 2015-09-24 – 2015-09-25 (×3): 10 mg via ORAL
  Filled 2015-09-24 (×4): qty 1

## 2015-09-24 MED ORDER — ACETAMINOPHEN 500 MG PO TABS
1000.0000 mg | ORAL_TABLET | Freq: Once | ORAL | Status: AC
  Administered 2015-09-24: 1000 mg via ORAL
  Filled 2015-09-24: qty 2

## 2015-09-24 MED ORDER — DOCUSATE SODIUM 100 MG PO CAPS
100.0000 mg | ORAL_CAPSULE | Freq: Every day | ORAL | Status: DC
  Administered 2015-09-24: 100 mg via ORAL

## 2015-09-24 MED ORDER — DEXTROSE 5 % IV SOLN: 1.0000 g | INTRAVENOUS | Status: DC

## 2015-09-24 MED ORDER — FLUTICASONE PROPIONATE 50 MCG/ACT NA SUSP
2.0000 | Freq: Every day | NASAL | Status: DC
  Administered 2015-09-24 – 2015-09-25 (×2): 2 via NASAL
  Filled 2015-09-24: qty 16

## 2015-09-24 MED ORDER — HEPARIN SODIUM (PORCINE) 5000 UNIT/ML IJ SOLN: 5000.0000 [IU] | Freq: Three times a day (TID) | INTRAMUSCULAR | Status: DC

## 2015-09-24 MED ORDER — ACETAMINOPHEN 650 MG RE SUPP: 650.0000 mg | Freq: Four times a day (QID) | RECTAL | Status: DC | PRN

## 2015-09-24 MED ORDER — ACETAMINOPHEN 325 MG PO TABS: 650.0000 mg | ORAL_TABLET | Freq: Four times a day (QID) | ORAL | Status: DC | PRN

## 2015-09-24 MED ORDER — IOHEXOL 300 MG/ML  SOLN
50.0000 mL | Freq: Once | INTRAMUSCULAR | Status: AC | PRN
  Administered 2015-09-24: 50 mL via ORAL

## 2015-09-24 MED ORDER — ONDANSETRON HCL 4 MG PO TABS: 4.0000 mg | ORAL_TABLET | Freq: Three times a day (TID) | ORAL | Status: DC | PRN

## 2015-09-24 MED ORDER — DEXTROSE 5 % IV SOLN
2.0000 g | Freq: Once | INTRAVENOUS | Status: AC
  Administered 2015-09-24: 2 g via INTRAVENOUS
  Filled 2015-09-24: qty 2

## 2015-09-24 MED ORDER — SODIUM CHLORIDE 0.9 % IJ SOLN
3.0000 mL | Freq: Two times a day (BID) | INTRAMUSCULAR | Status: DC
  Administered 2015-09-24: 3 mL via INTRAVENOUS

## 2015-09-24 MED ORDER — PRAVASTATIN SODIUM 10 MG PO TABS
10.0000 mg | ORAL_TABLET | Freq: Every day | ORAL | Status: DC
Start: 2015-09-24 — End: 2015-09-25
  Administered 2015-09-24 – 2015-09-25 (×2): 10 mg via ORAL
  Filled 2015-09-24 (×2): qty 1

## 2015-09-24 MED ORDER — DEXTROSE 5 % IV SOLN
1.0000 g | INTRAVENOUS | Status: DC
  Administered 2015-09-25: 1 g via INTRAVENOUS
  Filled 2015-09-24: qty 10

## 2015-09-24 NOTE — ED Notes (Signed)
Patient is resting comfortably. Oral contrast finished.

## 2015-09-24 NOTE — ED Notes (Signed)
Patient is from [facility] and brought in for altered mental status than his usual baseline. Pt was found by staff to be agitated, reporting he was falling out of bed. EMS reported his feet were against the wall, clawing at the wall and flushedl. In the process, pt left big toe has been injured. After EMS got patient moved to their stretcher, his agitated deceased. Pt was calm and cooperative when entering the facility.

## 2015-09-24 NOTE — ED Provider Notes (Signed)
81 year old male signed out to me at shift change by will Marijo Fileansie PA-C pending CT scan. Brief history: Patient is an 81 year old male with history dementia, CK-MB, anxiety, depression, gait disorder who presented to the emergency room from assisted living facility with altered mental status and fever. Family notes that yesterday he was altered, unable to provide sufficient information or details, and this was abnormal for him. She reports that approximately one year ago patient was admitted to the hospital for an identical situation with altered mental status and likely urinary tract infection. Urine culture did not grow out any bacteria, patient was treated with antibiotics and symptoms resolved on their own. At the time of my evaluation patient's fever had improved with antibiotic diuretics here in the ED, he was alert, but and oriented to place or time, event surrounding admission. He reports minor right lower quadrant pain, but is unable to provide any further details of his general well-being. Urine today shows negative leukocytes, fever originally 101.4, remainder of vital signs reassuring, lactic normal no signs of urosepsis. CT scan showed bladder wall irregularity and surrounding soft tissue inflammation with concerns for cystitis. Patient also had a large 1.9 x 1.5 cm stone at the right renal pelvis with surrounding soft tissue inflammation. Hospital consult for altered mental status, fever, for further evaluation and management. Patient was started on Rocephin here in the ED, I contacted his wife informed her of recent results pending hospital admission. Urology consulted who will be seeing the patient.  Eyvonne MechanicJeffrey Kendrell Lottman, PA-C 09/24/15 [id number]  Benjiman CoreNathan Pickering, MD 09/27/15 [phone]

## 2015-09-24 NOTE — H&P (Signed)
[facility] History and Physical  [employer] KVQ:[id number] DOB: 1925-01-23 DOA: 09/24/2015  Referring physician: Dr. Rubin Payor PCP: Florentina Jenny, MD   Chief Complaint: AMS  HPI: Allen Vega. is a 81 y.o. male  With history of dementia who presented to the hospital with altered mental status. The history is obtained from ED physician and EMR S patient as per unable to provide accurate history. Of note though he denies any dysuria or any difficulty breathing. Reportedly patient has had progressive confusion based on family report. This has gone on for the last day or 2. As a result patient presented to the hospital with the assistance of family for further evaluation and recommendations.  Per my discussion with the ED personnel currently working diagnosis is suspicious for UTI. There is also reported nephrolithiasis of which ED personnel will consult urology for further evaluation recommendations.  Review of Systems:  Unable to accurately assess secondary to dementia  Past Medical History  Diagnosis Date  . Occlusion and stenosis of carotid artery without mention of cerebral infarction   . Impaired fasting glucose   . Other and unspecified hyperlipidemia   . Obstructive sleep apnea (adult) (pediatric)   . Chronic kidney disease, stage II (mild)   . Osteoarthrosis, unspecified whether generalized or localized, lower leg   . Allergic rhinitis, cause unspecified   . Esophageal reflux   . Unspecified vitamin D deficiency   . Anxiety state, unspecified   . Depressive disorder, not elsewhere classified   . 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 Memory loss 08/28/2013   Past Surgical History  Procedure Laterality Date  . Carotid endarterectomy Right   . Inguinal hernia repair  2011  . Turp vaporization     Social History:  reports that he has never smoked. He has never used smokeless tobacco. He reports that he drinks alcohol. He reports that he does not use  illicit drugs.  Allergies  Allergen Reactions  . Gadobenate Dimeglumine [Gadobenate] Rash  . Seasonal Ic [Cholestatin]     Family History  Problem Relation Age of Onset  . Family history unknown: Yes    Prior to Admission medications   Medication Sig Start Date End Date Taking? Authorizing Provider  Acetaminophen (TYLENOL EXTRA STRENGTH PO) Take 500 mg by mouth 3 (three) times daily as needed.    Yes Historical Provider, MD  aspirin 81 MG tablet Take 81 mg by mouth at bedtime.    Yes Historical Provider, MD  cetirizine (ZYRTEC) 10 MG tablet Take 10 mg by mouth daily.   Yes Historical Provider, MD  divalproex (DEPAKOTE) 250 MG DR tablet Take 375 mg by mouth 2 (two) times daily.  08/06/15  Yes Historical Provider, MD  docusate sodium (COLACE) 100 MG capsule Take 100 mg by mouth at bedtime.   Yes Historical Provider, MD  fluticasone (FLONASE) 50 MCG/ACT nasal spray Place 2 sprays into both nostrils daily.   Yes Historical Provider, MD  Glycerin, Laxative, (ADULT SUPPOSITORY RE) Place 1 applicator rectally daily as needed (constipation).   Yes Historical Provider, MD  lisinopril (PRINIVIL,ZESTRIL) 2.5 MG tablet Take 2.5 mg by mouth daily.  06/04/15  Yes Historical Provider, MD  memantine (NAMENDA) 10 MG tablet Take 1 tablet (10 mg total) by mouth 2 (two) times daily. 04/19/14  Yes Huston Foley, MD  Multiple Vitamin (MULTIVITAMIN WITH MINERALS) TABS tablet Take 1 tablet by mouth daily.   Yes Historical Provider, MD  ondansetron (ZOFRAN) 4 MG tablet Take 4 mg by  mouth every 8 (eight) hours as needed for nausea or vomiting.   Yes Historical Provider, MD  pravastatin (PRAVACHOL) 10 MG tablet Take 10 mg by mouth daily.  04/16/15  Yes Historical Provider, MD  traMADol (ULTRAM) 50 MG tablet Take 1 tablet (50 mg total) by mouth 2 (two) times daily as needed for moderate pain or severe pain (pain). 09/20/14  Yes Kathlen ModyVijaya Akula, MD  venlafaxine XR (EFFEXOR-XR) 37.5 MG 24 hr capsule Take 37.5 mg by mouth daily.  09/13/15  Yes Historical Provider, MD   Physical Exam: Filed Vitals:   09/24/15 0815 09/24/15 0830 09/24/15 0900 09/24/15 0930  BP:  127/59 136/68 110/44  Pulse: 59     Temp:      TempSrc:      Resp: 19 13 21 15   SpO2: 95%       Wt Readings from Last 3 Encounters:  12/18/14 92.987 kg (205 lb)  09/18/14 88.7 kg (195 lb 8.8 oz)  09/05/14 93.441 kg (206 lb)    General:  Appears calm and comfortable Eyes: PERRL, normal lids, irises & conjunctiva ENT: grossly normal hearing, lips & tongue Neck: no LAD, masses or thyromegaly Cardiovascular: RRR, no m/r/g. No LE edema. Respiratory: CTA bilaterally, no w/r/r. Normal respiratory effort. Abdomen: soft, nt, nd Skin: no rash or induration seen on limited exam Musculoskeletal: grossly normal tone BUE/BLE Psychiatric: Unable to accurately assess secondary to dementia Neurologic: No facial asymmetry, moves extremities equally           Labs on Admission:  Basic Metabolic Panel:  Recent Labs Lab 09/24/15 0308  NA 146*  K 3.4*  CL 112*  CO2 25  GLUCOSE 109*  BUN 17  CREATININE 0.79  CALCIUM 8.5*   Liver Function Tests:  Recent Labs Lab 09/24/15 0308  AST 40  ALT 34  ALKPHOS 41  BILITOT 0.8  PROT 5.9*  ALBUMIN 3.2*   No results for input(s): LIPASE, AMYLASE in the last 168 hours. No results for input(s): AMMONIA in the last 168 hours. CBC:  Recent Labs Lab 09/24/15 0308  WBC 5.0  NEUTROABS 3.4  HGB 12.4*  HCT 36.0*  MCV 91.4  PLT 83*   Cardiac Enzymes: No results for input(s): CKTOTAL, CKMB, CKMBINDEX, TROPONINI in the last 168 hours.  BNP (last 3 results) No results for input(s): BNP in the last 8760 hours.  ProBNP (last 3 results) No results for input(s): PROBNP in the last 8760 hours.  CBG: No results for input(s): GLUCAP in the last 168 hours.  Radiological Exams on Admission: Ct Abdomen Pelvis Wo Contrast  09/24/2015  CLINICAL DATA:  Acute onset of generalized abdominal pain and fever. Red  blood cells in the urine. Initial encounter. EXAM: CT ABDOMEN AND PELVIS WITHOUT CONTRAST TECHNIQUE: Multidetector CT imaging of the abdomen and pelvis was performed following the standard protocol without IV contrast. COMPARISON:  CT of the abdomen and pelvis from 09/13/2008 FINDINGS: Mild bibasilar atelectasis or scarring is noted. There is aneurysmal dilatation of the ascending thoracic aorta to 4.5 cm in AP dimension. Diffuse coronary artery calcifications are seen. The liver and spleen are unremarkable in appearance. The gallbladder is within normal limits. The pancreas and adrenal glands are unremarkable. There is a large 1.9 x 1.5 cm stone at the right renal pelvis, with surrounding soft tissue inflammation. This may reflect some degree of infection. Mild nonspecific perinephric stranding is noted bilaterally. A small right renal cyst is seen, measuring 1.2 cm. No additional renal or ureteral stones are  identified. No significant hydronephrosis is seen. No free fluid is identified. The small bowel is unremarkable in appearance. The stomach is within normal limits. No acute vascular abnormalities are seen. Scattered calcification is noted along the abdominal aorta and its branches. The appendix is not definitely characterized; there is no evidence for appendicitis. Scattered diverticulosis is noted along the sigmoid colon, without evidence of diverticulitis. The rectum is mildly distended with stool, measuring up to 7.8 cm in diameter. The bladder is mildly distended. Apparent bladder wall irregularity and surrounding soft tissue inflammation raises concern for cystitis, though a mass cannot be excluded. The remaining prostate is normal in size. No inguinal lymphadenopathy is seen. No acute osseous abnormalities are identified. IMPRESSION: 1. Apparent bladder wall irregularity and surrounding soft tissue inflammation raises concern for cystitis. Given the patient's red blood cells in the urine, a mass cannot  be entirely excluded. If the patient's symptoms persist, cystoscopy could be considered. 2. Large 1.9 x 1.5 cm stone at the right renal pelvis, with surrounding soft inflammation. This may reflect some degree of infection. No evidence of hydronephrosis. 3. Small right renal cyst seen. 4. Mild bibasilar atelectasis or scarring noted. 5. Aneurysmal dilatation of the ascending thoracic aorta to 4.5 cm in AP dimension. This tends to remain within normal limits given the patient's age. 6. Diffuse coronary artery calcifications seen 7. Scattered calcification along the abdominal aorta and its branches. 8. Scattered diverticulosis along the sigmoid colon, without evidence of diverticulitis. 9. Rectum mildly distended with stool, measuring up to 7.8 cm in diameter acute. Electronically Signed   By: Roanna Raider M.D.   On: 09/24/2015 06:15   Dg Chest Port 1 View  09/24/2015  CLINICAL DATA:  Acute onset of fever and altered mental status. Initial encounter. EXAM: PORTABLE CHEST 1 VIEW COMPARISON:  Chest radiograph performed 02/28/2010 FINDINGS: The lungs are well-aerated. Vascular congestion is noted. Increased interstitial markings raise concern for mild interstitial edema, though mild pneumonia might have a similar appearance, given the patient's symptoms. There is no evidence of pleural effusion or pneumothorax. The cardiomediastinal silhouette is borderline normal in size. No acute osseous abnormalities are seen. IMPRESSION: Vascular congestion noted. Increased interstitial markings raise concern for mild interstitial edema, though mild pneumonia might have a similar appearance, given the patient's symptoms. Electronically Signed   By: Roanna Raider M.D.   On: 09/24/2015 03:22    EKG: Independently reviewed. Sinus rhythm with no ST elevations or depressions  Assessment/Plan Active Problems:   Altered mental status - Most likely due to infectious etiology (cystitis)  UTI - Treat with Rocephin - awaiting  urine culture results.    Dementia - continue home medication regimen    Nephrolithiasis - Urologist consulted - No hydronephrosis reported on imaging scan   Code Status: presumed full DVT Prophylaxis: heparin Family Communication: None at bedside Disposition Plan: Med surg with Urology evaluation  Time spent: > 55 minutes  Penny Pia [facility] Pager [phone]

## 2015-09-24 NOTE — ED Notes (Addendum)
Resting quietly with eye closed. Easily arousable. Verbally responsive. Resp even and unlabored. No audible adventitious breath sounds noted. SR on monitor. ABC's intact. IV saline lock patent and intact. NAD noted.

## 2015-09-24 NOTE — ED Notes (Signed)
Pt assisted with bedpan and had lg formed soft stool.

## 2015-09-24 NOTE — Progress Notes (Signed)
PHARMACY NOTE -  Rocephin  Pharmacy has been assisting with dosing of Rocephin for UTI. Dosage of 2g x 1 has been ordered in ED, then further dosage adjustment appears unlikely at present.    Plan:  Rocephin 1g IV q24h  Further dosage adjustment appears unlikely at present.    Will sign off at this time.  Please reconsult if a change in clinical status warrants re-evaluation of dosage.  Loralee PacasErin Tulsi Crossett, PharmD, BCPS Pager: [phone] 09/24/2015 7:26 AM

## 2015-09-24 NOTE — ED Notes (Addendum)
Awake. Verbally responsive. A/O x3 with intermittent confusion. Resp even and unlabored. No audible adventitious breath sounds noted. ABC's intact. SR on monitor. IV saline lock patent and intact.

## 2015-09-24 NOTE — ED Notes (Addendum)
Pt wife: [phone] (home)  [phone]

## 2015-09-24 NOTE — ED Provider Notes (Signed)
CSN: [id number]     Arrival date & time 09/24/15  0202 History   First MD Initiated Contact with Patient 09/24/15 [phone]     Chief Complaint  Patient presents with  . Altered Mental Status   Level V Caveat: Dementia.   Allen Vega. is a 81 y.o. male with a history of dementia, chronic kidney disease, anxiety, depression and multifactorial gait disorder who presents to the emergency department from [facility] where staff are reporting the patient has been more altered tonight. The patient's daughter is at bedside also providing history. The patient's daughter reports he has calmed down now that he is around to the emergency department. EMS reports the patient was found lying in his bed with his feet up in the air on the wall. He did not fall. EMS states he was initially agitated but calmed down after getting onto the stretcher. The patient's daughter reports he's been behaving the same way today that he behaved when he had his last urinary tract infection one year ago. She reports he is having more difficulty ambulating today when going to eat dinner. Patient reports he is having some abdominal pain earlier today but denies any abdominal pain currently. He denies any nausea, vomiting, diarrhea, coughing, shortness of breath, wheezing or urinary symptoms.  (Consider location/radiation/quality/duration/timing/severity/associated sxs/prior Treatment) HPI  Past Medical History  Diagnosis Date  . Occlusion and stenosis of carotid artery without mention of cerebral infarction   . Impaired fasting glucose   . Other and unspecified hyperlipidemia   . Obstructive sleep apnea (adult) (pediatric)   . Chronic kidney disease, stage II (mild)   . Osteoarthrosis, unspecified whether generalized or localized, lower leg   . Allergic rhinitis, cause unspecified   . Esophageal reflux   . Unspecified vitamin D deficiency   . Anxiety state, unspecified   . Depressive disorder, not elsewhere  classified   . 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 Memory loss 08/28/2013   Past Surgical History  Procedure Laterality Date  . Carotid endarterectomy Right   . Inguinal hernia repair  2011  . Turp vaporization     Family History  Problem Relation Age of Onset  . Family history unknown: Yes   Social History  Substance Use Topics  . Smoking status: Never Smoker   . Smokeless tobacco: Never Used  . Alcohol Use: 0.0 oz/week    0 Standard drinks or equivalent per week     Comment: occas wine    Review of Systems  Unable to perform ROS: Dementia  Constitutional: Positive for fever.  Gastrointestinal: Negative for vomiting and diarrhea.  Skin: Negative for rash.      Allergies  Gadobenate dimeglumine and Seasonal ic  Home Medications   Prior to Admission medications   Medication Sig Start Date End Date Taking? Authorizing Provider  Acetaminophen (TYLENOL EXTRA STRENGTH PO) Take 500 mg by mouth 3 (three) times daily as needed.    Yes Historical Provider, MD  aspirin 81 MG tablet Take 81 mg by mouth at bedtime.    Yes Historical Provider, MD  cetirizine (ZYRTEC) 10 MG tablet Take 10 mg by mouth daily.   Yes Historical Provider, MD  divalproex (DEPAKOTE) 250 MG DR tablet Take 375 mg by mouth 2 (two) times daily.  08/06/15  Yes Historical Provider, MD  docusate sodium (COLACE) 100 MG capsule Take 100 mg by mouth at bedtime.   Yes Historical Provider, MD  fluticasone (FLONASE) 50 MCG/ACT nasal spray Place 2 sprays into  both nostrils daily.   Yes Historical Provider, MD  Glycerin, Laxative, (ADULT SUPPOSITORY RE) Place 1 applicator rectally daily as needed (constipation).   Yes Historical Provider, MD  lisinopril (PRINIVIL,ZESTRIL) 2.5 MG tablet Take 2.5 mg by mouth daily.  06/04/15  Yes Historical Provider, MD  memantine (NAMENDA) 10 MG tablet Take 1 tablet (10 mg total) by mouth 2 (two) times daily. 04/19/14  Yes Huston Foley, MD  Multiple Vitamin (MULTIVITAMIN WITH MINERALS)  TABS tablet Take 1 tablet by mouth daily.   Yes Historical Provider, MD  ondansetron (ZOFRAN) 4 MG tablet Take 4 mg by mouth every 8 (eight) hours as needed for nausea or vomiting.   Yes Historical Provider, MD  pravastatin (PRAVACHOL) 10 MG tablet Take 10 mg by mouth daily.  04/16/15  Yes Historical Provider, MD  traMADol (ULTRAM) 50 MG tablet Take 1 tablet (50 mg total) by mouth 2 (two) times daily as needed for moderate pain or severe pain (pain). 09/20/14  Yes Kathlen Mody, MD  venlafaxine XR (EFFEXOR-XR) 37.5 MG 24 hr capsule Take 37.5 mg by mouth daily. 09/13/15  Yes Historical Provider, MD   BP 122/54 mmHg  Pulse 70  Temp(Src) 101.4 F (38.6 C) (Rectal)  Resp 11  SpO2 94% Physical Exam  Constitutional: He appears well-developed and well-nourished. No distress.  Nontoxic appearing.  HENT:  Head: Normocephalic and atraumatic.  Mouth/Throat: Oropharynx is clear and moist. No oropharyngeal exudate.  Eyes: Conjunctivae are normal. Pupils are equal, round, and reactive to light. Right eye exhibits no discharge. Left eye exhibits no discharge.  Neck: Normal range of motion. Neck supple.  Cardiovascular: Normal rate, regular rhythm, normal heart sounds and intact distal pulses.  Exam reveals no gallop and no friction rub.   No murmur heard. Good capillary refill.  Pulmonary/Chest: Effort normal and breath sounds normal. No respiratory distress. He has no wheezes. He has no rales.  Lungs are clear to auscultation bilaterally.  Abdominal: Soft. Bowel sounds are normal. He exhibits no distension and no mass. There is tenderness. There is no rebound and no guarding.  Abdomen soft and bowel sounds are present. Patient has mild right-sided abdominal tenderness to palpation. No peritoneal signs. No CVA or flank tenderness.  Genitourinary: Penis normal.  No rashes noted.  Musculoskeletal: He exhibits no edema.  No lower extremity edema.  Lymphadenopathy:    He has no cervical adenopathy.   Neurological: He is alert. Coordination normal.  He is alert and oriented 2.  Skin: Skin is warm and dry. No rash noted. He is not diaphoretic. No erythema. No pallor.  Patient has an abrasion to his left big toe. Reports this is a previous injury that may have been reopened. Bleeding is controlled.  Psychiatric: He has a normal mood and affect. His behavior is normal.  Nursing note and vitals reviewed.   ED Course  Procedures (including critical care time) Labs Review Labs Reviewed  COMPREHENSIVE METABOLIC PANEL - Abnormal; Notable for the following:    Sodium 146 (*)    Potassium 3.4 (*)    Chloride 112 (*)    Glucose, Bld 109 (*)    Calcium 8.5 (*)    Total Protein 5.9 (*)    Albumin 3.2 (*)    All other components within normal limits  CBC WITH DIFFERENTIAL/PLATELET - Abnormal; Notable for the following:    RBC 3.94 (*)    Hemoglobin 12.4 (*)    HCT 36.0 (*)    Platelets 83 (*)    All  other components within normal limits  URINALYSIS, ROUTINE W REFLEX MICROSCOPIC (NOT AT [facility]) - Abnormal; Notable for the following:    Color, Urine AMBER (*)    APPearance CLOUDY (*)    Hgb urine dipstick LARGE (*)    Bilirubin Urine SMALL (*)    Ketones, ur 15 (*)    Protein, ur 30 (*)    All other components within normal limits  VALPROIC ACID LEVEL - Abnormal; Notable for the following:    Valproic Acid Lvl 34 (*)    All other components within normal limits  URINE CULTURE  CULTURE, BLOOD (ROUTINE X 2)  CULTURE, BLOOD (ROUTINE X 2)  URINE MICROSCOPIC-ADD ON  I-STAT CG4 LACTIC ACID, ED    Imaging Review Dg Chest Port 1 View  09/24/2015  CLINICAL DATA:  Acute onset of fever and altered mental status. Initial encounter. EXAM: PORTABLE CHEST 1 VIEW COMPARISON:  Chest radiograph performed 02/28/2010 FINDINGS: The lungs are well-aerated. Vascular congestion is noted. Increased interstitial markings raise concern for mild interstitial edema, though mild pneumonia might have a similar  appearance, given the patient's symptoms. There is no evidence of pleural effusion or pneumothorax. The cardiomediastinal silhouette is borderline normal in size. No acute osseous abnormalities are seen. IMPRESSION: Vascular congestion noted. Increased interstitial markings raise concern for mild interstitial edema, though mild pneumonia might have a similar appearance, given the patient's symptoms. Electronically Signed   By: Roanna Raider M.D.   On: 09/24/2015 03:22   I have personally reviewed and evaluated these images and lab results as part of my medical decision-making.   EKG Interpretation   Date/Time:  Tuesday September 24 2015 02:15:41 EST Ventricular Rate:  84 PR Interval:  162 QRS Duration: 141 QT Interval:  417 QTC Calculation: 493 R Axis:   71 Text Interpretation:  Sinus rhythm Right bundle branch block Baseline  wander in lead(s) V6 Confirmed by Rubin Payor  MD, NATHAN [phone]) on  09/24/2015 3:58:27 AM      Filed Vitals:   09/24/15 0210 09/24/15 0230 09/24/15 0330 09/24/15 0400  BP:  122/53 126/54 122/54  Pulse:  83 69 70  Temp: 101.4 F (38.6 C)     TempSrc: Rectal     Resp:  SpO2:  95% 94% 94%     MDM   Meds given in ED:  Medications  acetaminophen (TYLENOL) tablet 1,000 mg (1,000 mg Oral Given 09/24/15 0256)  sodium chloride 0.9 % bolus 1,000 mL (0 mLs Intravenous Stopped 09/24/15 0338)  iohexol (OMNIPAQUE) 300 MG/ML solution 50 mL (50 mLs Oral Contrast Given 09/24/15 0428)    New Prescriptions   No medications on file    Final diagnoses:  Fever   This  is a 81 y.o. male with a history of dementia, chronic kidney disease, anxiety, depression and multifactorial gait disorder who presents to the emergency department from [facility] where staff are reporting the patient has been more altered tonight. The patient's daughter is at bedside also providing history. The patient's daughter reports he has calmed down now that he is around to the  emergency department. EMS reports the patient was found lying in his bed with his feet up in the air on the wall. He did not fall. EMS states he was initially agitated but calmed down after getting onto the stretcher. The patient's daughter reports he's been behaving the same way today that he behaved when he had his last urinary tract infection one year ago. She reports he  is having more difficulty ambulating today when going to eat dinner. Patient reports he is having some abdominal pain earlier today but denies any abdominal pain currently. On exam the patient has its temperature 101.4. He is nontoxic appearing. He is not hypotensive or hypoxic. He is alert and oriented 2. His abdomen is soft and he has right-sided abdominal tenderness to palpation. No peritoneal signs. Urinalysis is nitrite and leukocyte negative. He does show large hemoglobin. CMP shows a sodium of 146 with a potassium of 3.4. Creatinine is 0.79. CBC shows no leukocytosis. Hemoglobin is 12.4. Valproic acid level is 34. Lactic acid is 1.35. Blood cultures ordered and pending. Chest x-ray shows vascular congestion with increased interstitial markings concerning for interstitial edema or mild pneumonia. On exam patient's lungs are clear to auscultation bilaterally and he has had no coughing. I doubt pneumonia at this time. As the urinalysis was negative for infection will obtain CT abdomen and pelvis. Patient has contrast allergy. Will obtain with oral contrast only.  At shift change the patient is awaiting CT scan. [facility] at shift change who will disposition the patient.   This patient was discussed with Dr. Rubin PayorPickering who agrees with assessment and plan.     Everlene FarrierWilliam Faythe Heitzenrater, PA-C 09/24/15 1346  Benjiman CoreNathan Pickering, MD 09/27/15 [phone]

## 2015-09-24 NOTE — ED Notes (Addendum)
Awake. Verbally responsive. A/O x3 with intermittent confusion. Resp even and unlabored. No audible adventitious breath sounds noted. ABC's intact. SR on monitor. IV saline lock patent and intact. Pt given bedpan to void.

## 2015-09-24 NOTE — ED Notes (Signed)
Admitting MD at bedside.

## 2015-09-24 NOTE — Clinical Social Work Note (Signed)
Clinical Social Work Assessment  Patient Details  Name: Allen ChestnutRichard F Akram Jr. MRN: [id number] Date of Birth: 1925/02/19  Date of referral:  09/24/15               Reason for consult:  Facility Placement, Discharge Planning                Permission sought to share information with:  Facility Industrial/product designerContact Representative Permission granted to share information::  Yes, Verbal Permission Granted  Name::        Agency::     Relationship::     Contact Information:     Housing/Transportation Living arrangements for the past 2 months:  Assisted Living Facility Source of Information:  Spouse, Facility Patient Interpreter Needed:  None Criminal Activity/Legal Involvement Pertinent to Current Situation/Hospitalization:  No - Comment as needed Significant Relationships:    Lives with:  Spouse Do you feel safe going back to the place where you live?  Yes Need for family participation in patient care:  Yes (Comment)  Care giving concerns:  Spouse reports that pt needs assistance with ambulation and is walker is at ALF. This has been reported to nsg and reassurance provided to spouse.   Social Worker assessment / plan:  Pt hospitalized on 09/24/15 with AMS. [facility] ). CSW unable to meet with pt ( care being provided ) at this time. Spouse contacted to assist with d/c planning. Spouse reports that pt was receiving PT at facility. Spouse reports that pt needs assistance with ADLs. Spouse reports that she would like pt to return to ALF at d/c.  ALF contacted and clinicals sent for review. CSW will continue to follow to assist wit d/c planning needs.  Employment status:  Retired Database administratornsurance information:  Managed Medicare PT Recommendations:  Not assessed at this time Information / Referral to community resources:   (ALF)  Patient/Family's Response to care:  Spouse would like pt to return to ALF with continued HHPT.  Patient/Family's Understanding of and Emotional  Response to Diagnosis, Current Treatment, and Prognosis:  Spouse is aware of pt's medical status. Spouse is pleased with the [facility] has [facility]. " My husband has some behavioral issues associated with his dementia and [facility]. " Nsg reports pt has been cooperative during hospitalization.  Emotional Assessment Appearance:  Appears stated age Attitude/Demeanor/Rapport:  Other (cooperative) Affect (typically observed):  Appropriate Orientation:  Oriented to Self, Oriented to Place, Oriented to Situation, Oriented to  Time Alcohol / Substance use:  Not Applicable Psych involvement (Current and /or in the community):  No (Comment)  Discharge Needs  Concerns to be addressed:  Discharge Planning Concerns Readmission within the last 30 days:  Yes Current discharge risk:  None Barriers to Discharge:  No Barriers Identified   Vennie HomansHaidinger, Allen Raysor Vega, Allen Vega  [phone] 09/24/2015, 3:34 PM

## 2015-09-24 NOTE — ED Notes (Signed)
Patient transported to CT 

## 2015-09-24 NOTE — Consult Note (Signed)
Urology Consult  Referring physician: Velvet Bathe, MD Reason for referral: Fever, right UPJ stone  Chief Complaint: Right UPJ calculus  History of Present Illness:  81 y.o. male with history of dementia who presented to the hospital with altered mental status. The history is obtained from the patient's wife and from review of the notes from the ER physician and hospitalist as patient is unable to provide accurate history. Reportedly patient has had progressive confusion based on family report. This has gone on for the last day or two. As a result patient presented to the hospital with the assistance of family for further evaluation and recommendations. His wife also reports that there was a recent outbreak of Norovirus at the facility and that she was told that he had contracted the virus and had been quarantined to his room until yesterday. He had been on a very restricted amount of clear liquids and had not had any contact with other people at his facility or left his room for several days which she felt could account for the acute worsening of his mental status as well as his fevers.   The patient denies any current or recent dysuria, gross hematuria, flank pain, urinary urgency, frequency, nausea or vomiting.   In the ER he was febrile to 101.4 with the remainder of his vital signs stable. No leukocytosis with WBC of 5.0, creatinine stable at 0.79. UA significant for large blood and TNTC RBCs but no evidence of infection with negative leukocytes and nitrites, 0-5 WBCs and no bacteria. CT demonstrated 1.9 x 1.5 cm right UPJ stone without evidence of obstruction with no associated hydronephrosis or stranding. No obstructing ureteral stones seen.    Urology was consulted due to the presence of fevers and a large UPJ stone.   Past Medical History  Diagnosis Date  . Occlusion and stenosis of carotid artery without mention of cerebral infarction   . Impaired fasting glucose   . Other and  unspecified hyperlipidemia   . Obstructive sleep apnea (adult) (pediatric)   . Chronic kidney disease, stage II (mild)   . Osteoarthrosis, unspecified whether generalized or localized, lower leg   . Allergic rhinitis, cause unspecified   . Esophageal reflux   . Unspecified vitamin D deficiency   . Anxiety state, unspecified   . Depressive disorder, not elsewhere classified   . 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 Memory loss 08/28/2013   Past Surgical History  Procedure Laterality Date  . Carotid endarterectomy Right   . Inguinal hernia repair  2011  . Turp vaporization      Medications: I have reviewed the patient's current medications. Allergies:  Allergies  Allergen Reactions  . Gadobenate Dimeglumine [Gadobenate] Rash  . Seasonal Ic [Cholestatin]     Family History  Problem Relation Age of Onset  . Family history unknown: Yes   Social History:  reports that he has never smoked. He has never used smokeless tobacco. He reports that he drinks alcohol. He reports that he does not use illicit drugs.  ROS 12 system review was performed and was negative except for the pertinent positives listed in the HPI.  Physical Exam:  Vital signs in last 24 hours: Temp:  [97.7 F (36.5 C)-101.4 F (38.6 C)] 97.7 F (36.5 C) (12/27 0707) Pulse Rate:  [59-89] 59 (12/27 0815) Resp:  [11-23] 15 (12/27 0930) BP: (110-153)/(44-68) 110/44 mmHg (12/27 0930) SpO2:  [92 %-99 %] 95 % (12/27 0815) Physical Exam  Constitutional: He appears well-developed and well-nourished. No  distress.  HENT:  Head: Normocephalic and atraumatic.  Respiratory: No respiratory distress.  GI: He exhibits no distension. There is no tenderness.  No CVA tenderness bilaterally.  Musculoskeletal: He exhibits no edema.  Neurological: He is alert.  Skin: Skin is warm and dry. He is not diaphoretic.    Laboratory Data:  Results for orders placed or performed during the hospital encounter of 09/24/15 (from the  past 72 hour(s))  I-Stat CG4 Lactic Acid, ED     Status: None   Collection Time: 09/24/15  3:02 AM  Result Value Ref Range   Lactic Acid, Venous 1.35 0.5 - 2.0 mmol/L  Comprehensive metabolic panel     Status: Abnormal   Collection Time: 09/24/15  3:08 AM  Result Value Ref Range   Sodium 146 (H) 135 - 145 mmol/L   Potassium 3.4 (L) 3.5 - 5.1 mmol/L   Chloride 112 (H) 101 - 111 mmol/L   CO2 25 22 - 32 mmol/L   Glucose, Bld 109 (H) 65 - 99 mg/dL   BUN 17 6 - 20 mg/dL   Creatinine, Ser 0.79 0.61 - 1.24 mg/dL   Calcium 8.5 (L) 8.9 - 10.3 mg/dL   Total Protein 5.9 (L) 6.5 - 8.1 g/dL   Albumin 3.2 (L) 3.5 - 5.0 g/dL   AST 40 15 - 41 U/L   ALT 34 17 - 63 U/L   Alkaline Phosphatase 41 38 - 126 U/L   Total Bilirubin 0.8 0.3 - 1.2 mg/dL   GFR calc non Af Amer >60 >60 mL/min   GFR calc Af Amer >60 >60 mL/min    Comment: (NOTE) The eGFR has been calculated using the CKD EPI equation. This calculation has not been validated in all clinical situations. eGFR's persistently <60 mL/min signify possible Chronic Kidney Disease.    Anion gap 9 5 - 15  CBC with Differential     Status: Abnormal   Collection Time: 09/24/15  3:08 AM  Result Value Ref Range   WBC 5.0 4.0 - 10.5 K/uL   RBC 3.94 (L) 4.22 - 5.81 MIL/uL   Hemoglobin 12.4 (L) 13.0 - 17.0 g/dL   HCT 36.0 (L) 39.0 - 52.0 %   MCV 91.4 78.0 - 100.0 fL   MCH 31.5 26.0 - 34.0 pg   MCHC 34.4 30.0 - 36.0 g/dL   RDW 14.8 11.5 - 15.5 %   Platelets 83 (L) 150 - 400 K/uL    Comment: REPEATED TO VERIFY SPECIMEN CHECKED FOR CLOTS PLATELET COUNT CONFIRMED BY SMEAR    Neutrophils Relative % 68 %   Neutro Abs 3.4 1.7 - 7.7 K/uL   Lymphocytes Relative 14 %   Lymphs Abs 0.7 0.7 - 4.0 K/uL   Monocytes Relative 16 %   Monocytes Absolute 0.8 0.1 - 1.0 K/uL   Eosinophils Relative 1 %   Eosinophils Absolute 0.1 0.0 - 0.7 K/uL   Basophils Relative 0 %   Basophils Absolute 0.0 0.0 - 0.1 K/uL  Valproic acid level     Status: Abnormal   Collection  Time: 09/24/15  3:08 AM  Result Value Ref Range   Valproic Acid Lvl 34 (L) 50.0 - 100.0 ug/mL  Urinalysis, Routine w reflex microscopic     Status: Abnormal   Collection Time: 09/24/15  3:30 AM  Result Value Ref Range   Color, Urine AMBER (A) YELLOW    Comment: BIOCHEMICALS MAY BE AFFECTED BY COLOR   APPearance CLOUDY (A) CLEAR   Specific Gravity, Urine 1.026 1.005 -  1.030   pH 6.0 5.0 - 8.0   Glucose, UA NEGATIVE NEGATIVE mg/dL   Hgb urine dipstick LARGE (A) NEGATIVE   Bilirubin Urine SMALL (A) NEGATIVE   Ketones, ur 15 (A) NEGATIVE mg/dL   Protein, ur 30 (A) NEGATIVE mg/dL   Nitrite NEGATIVE NEGATIVE   Leukocytes, UA NEGATIVE NEGATIVE  Urine microscopic-add on     Status: None   Collection Time: 09/24/15  3:30 AM  Result Value Ref Range   Squamous Epithelial / LPF NONE SEEN NONE SEEN   WBC, UA 0-5 0 - 5 WBC/hpf   RBC / HPF TOO NUMEROUS TO COUNT 0 - 5 RBC/hpf   Bacteria, UA NONE SEEN NONE SEEN   No results found for this or any previous visit (from the past 240 hour(s)). Creatinine:  Recent Labs  09/24/15 0308  CREATININE 0.79    CT A/P, 09/24/15: EXAM: CT ABDOMEN AND PELVIS WITHOUT CONTRAST  TECHNIQUE: Multidetector CT imaging of the abdomen and pelvis was performed following the standard protocol without IV contrast.  COMPARISON: CT of the abdomen and pelvis from 09/13/2008  FINDINGS: Mild bibasilar atelectasis or scarring is noted. There is aneurysmal dilatation of the ascending thoracic aorta to 4.5 cm in AP dimension. Diffuse coronary artery calcifications are seen.  The liver and spleen are unremarkable in appearance. The gallbladder is within normal limits. The pancreas and adrenal glands are unremarkable.  There is a large 1.9 x 1.5 cm stone at the right renal pelvis, with surrounding soft tissue inflammation. This may reflect some degree of infection. Mild nonspecific perinephric stranding is noted bilaterally. A small right renal cyst is seen,  measuring 1.2 cm. No additional renal or ureteral stones are identified. No significant hydronephrosis is seen.  No free fluid is identified. The small bowel is unremarkable in appearance. The stomach is within normal limits. No acute vascular abnormalities are seen. Scattered calcification is noted along the abdominal aorta and its branches.  The appendix is not definitely characterized; there is no evidence for appendicitis. Scattered diverticulosis is noted along the sigmoid colon, without evidence of diverticulitis. The rectum is mildly distended with stool, measuring up to 7.8 cm in diameter.  The bladder is mildly distended. Apparent bladder wall irregularity and surrounding soft tissue inflammation raises concern for cystitis, though a mass cannot be excluded. The remaining prostate is normal in size. No inguinal lymphadenopathy is seen.  No acute osseous abnormalities are identified.  IMPRESSION: 1. Apparent bladder wall irregularity and surrounding soft tissue inflammation raises concern for cystitis. Given the patient's red blood cells in the urine, a mass cannot be entirely excluded. If the patient's symptoms persist, cystoscopy could be considered. 2. Large 1.9 x 1.5 cm stone at the right renal pelvis, with surrounding soft inflammation. This may reflect some degree of infection. No evidence of hydronephrosis. 3. Small right renal cyst seen. 4. Mild bibasilar atelectasis or scarring noted. 5. Aneurysmal dilatation of the ascending thoracic aorta to 4.5 cm in AP dimension. This tends to remain within normal limits given the patient's age. 6. Diffuse coronary artery calcifications seen 7. Scattered calcification along the abdominal aorta and its branches. 8. Scattered diverticulosis along the sigmoid colon, without evidence of diverticulitis. 9. Rectum mildly distended with stool, measuring up to 7.8 cm in diameter acute.   Impression/Assessment:  81 yo M  with dementia presents with worsened mental status over past two days and fevers on admission. There was evidence of blood on UA but no evidence of infection. Given the  blood in the urine, AMS and fevers he underwent a CT which demonstrates a large 1.9 cm stone within the right renal pelvis with no hydronephrosis or significant stranding. The stone accounts for the blood in the urine as it is certainly irritating to the mucosa of the pelvis, however, there are no signs of sepsis of urinary origin or an obstructing stone which would prompt surgical intervention. The patient is currently afebrile and his vital signs have remained stable since arrival. He denies any symptoms of a UTI or obstructing right ureteral stone. Additionally, he was recently diagnosed with Norovirus which was "going around" at his nursing facility and resulting in his quarantine and isolation for several days which would be a reasonable cause of both his fevers and increased confusion. Overall, he looks very well in the room and denies any symptoms.  Plan:  1. No evidence of UTI on UA or obstructing nephrolithiasis on CT. Patient is asymptomatic from UTI or obstructing stone standpoint. 2. Follow up urine and blood cultures 3. If became acutely septic - febrile with tachycardia and hypotension - please contact urology as we may consider cystoscopy and right ureteral stent placement to rule out obstructing stone and UTI as the cause for his fevers and sepsis 4. If patient remains stable and clinically improving no indication for inpatient surgical intervention 5. Agree with empiric antibiotics while cultures are pending and work up underway 6. He should follow up with [facility] as an outpatient for discussion of long term management of his right UPJ stone  Acie Fredrickson 09/24/2015, 12:19 PM     I performed a history and physical examination of the patient and discussed his management with the resident.  I reviewed the  resident's note and agree with the documented findings and plan of care.

## 2015-09-25 DIAGNOSIS — D696 Thrombocytopenia, unspecified: Secondary | ICD-10-CM | POA: Diagnosis not present

## 2015-09-25 DIAGNOSIS — G9340 Encephalopathy, unspecified: Secondary | ICD-10-CM

## 2015-09-25 DIAGNOSIS — R296 Repeated falls: Secondary | ICD-10-CM | POA: Diagnosis not present

## 2015-09-25 DIAGNOSIS — E876 Hypokalemia: Secondary | ICD-10-CM | POA: Diagnosis not present

## 2015-09-25 DIAGNOSIS — R2681 Unsteadiness on feet: Secondary | ICD-10-CM | POA: Diagnosis not present

## 2015-09-25 DIAGNOSIS — R4182 Altered mental status, unspecified: Secondary | ICD-10-CM | POA: Diagnosis not present

## 2015-09-25 DIAGNOSIS — N200 Calculus of kidney: Secondary | ICD-10-CM

## 2015-09-25 DIAGNOSIS — R29898 Other symptoms and signs involving the musculoskeletal system: Secondary | ICD-10-CM | POA: Diagnosis not present

## 2015-09-25 DIAGNOSIS — Z9181 History of falling: Secondary | ICD-10-CM | POA: Diagnosis not present

## 2015-09-25 DIAGNOSIS — R262 Difficulty in walking, not elsewhere classified: Secondary | ICD-10-CM | POA: Diagnosis not present

## 2015-09-25 LAB — BASIC METABOLIC PANEL
Anion gap: 6 (ref 5–15)
BUN: 18 mg/dL (ref 6–20)
CHLORIDE: 111 mmol/L (ref 101–111)
CO2: 27 mmol/L (ref 22–32)
CREATININE: 0.61 mg/dL (ref 0.61–1.24)
Calcium: 8.3 mg/dL — ABNORMAL LOW (ref 8.9–10.3)
GFR calc Af Amer: >60 mL/min (ref >60)
GFR calc non Af Amer: >60 mL/min (ref >60)
GLUCOSE: 91 mg/dL (ref 65–99)
Potassium: 3.3 mmol/L — ABNORMAL LOW (ref 3.5–5.1)
Sodium: 144 mmol/L (ref 135–145)

## 2015-09-25 LAB — CBC
HCT: 38.2 % — ABNORMAL LOW (ref 39.0–52.0)
Hemoglobin: 12.8 g/dL — ABNORMAL LOW (ref 13.0–17.0)
MCH: 30.7 pg (ref 26.0–34.0)
MCHC: 33.5 g/dL (ref 30.0–36.0)
MCV: 91.6 fL (ref 78.0–100.0)
PLATELETS: 85 10*3/uL — AB (ref 150–400)
RBC: 4.17 MIL/uL — ABNORMAL LOW (ref 4.22–5.81)
RDW: 14.9 % (ref 11.5–15.5)
WBC: 5.2 10*3/uL (ref 4.0–10.5)

## 2015-09-25 LAB — URINE CULTURE: CULTURE: NO GROWTH

## 2015-09-25 MED ORDER — POTASSIUM CHLORIDE CRYS ER 20 MEQ PO TBCR
40.0000 meq | EXTENDED_RELEASE_TABLET | Freq: Once | ORAL | Status: AC
  Administered 2015-09-25: 40 meq via ORAL
  Filled 2015-09-25: qty 2

## 2015-09-25 NOTE — Care Management Obs Status (Signed)
MEDICARE OBSERVATION STATUS NOTIFICATION   Patient Details  Name: Allen ChestnutRichard F Lieder Jr. MRN: [id number] Date of Birth: 1925/09/16   Medicare Observation Status Notification Given:  Yes    Alexis Goodelleele, Yoana Staib K, RN 09/25/2015, 11:41 AM

## 2015-09-25 NOTE — Discharge Instructions (Signed)
Confusion Confusion is the inability to think with your usual speed or clarity. Confusion may come on quickly or slowly over time. How quickly the confusion comes on depends on the cause. Confusion can be due to any number of causes. CAUSES   Concussion, head injury, or head trauma.  Seizures.  Stroke.  Fever.  Brain tumor.  Age related decreased brain function (dementia).  Heightened emotional states like rage or terror.  Mental illness in which the person loses the ability to determine what is real and what is not (hallucinations).  Infections such as a urinary tract infection (UTI).  Toxic effects from alcohol, drugs, or prescription medicines.  Dehydration and an imbalance of salts in the body (electrolytes).  Lack of sleep.  Low blood sugar (diabetes).  Low levels of oxygen from conditions such as chronic lung disorders.  Drug interactions or other medicine side effects.  Nutritional deficiencies, especially niacin, thiamine, vitamin C, or vitamin B.  Sudden drop in body temperature (hypothermia).  Change in routine, such as when traveling or hospitalized. SIGNS AND SYMPTOMS  People often describe their thinking as cloudy or unclear when they are confused. Confusion can also include feeling disoriented. That means you are unaware of where or who you are. You may also not know what the date or time is. If confused, you may also have difficulty paying attention, remembering, and making decisions. Some people also act aggressively when they are confused.  DIAGNOSIS  The medical evaluation of confusion may include:  Blood and urine tests.  X-rays.  Brain and nervous system tests.  Analyzing your brain waves (electroencephalogram or EEG).  Magnetic resonance imaging (MRI) of your head.  Computed tomography (CT) scan of your head.  Mental status tests in which your health care provider may ask many questions. Some of these questions may seem silly or strange,  but they are a very important test to help diagnose and treat confusion. TREATMENT  An admission to the hospital may not be needed, but a person with confusion should not be left alone. Stay with a family member or friend until the confusion clears. Avoid alcohol, pain relievers, or sedative drugs until you have fully recovered. Do not drive until directed by your health care provider. HOME CARE INSTRUCTIONS  What family and friends can do:  To find out if someone is confused, ask the person to state his or her name, age, and the date. If the person is unsure or answers incorrectly, he or she is confused.  Always introduce yourself, no matter how well the person knows you.  Often remind the person of his or her location.  Place a calendar and clock near the confused person.  Help the person with his or her medicines. You may want to use a pill box, an alarm as a reminder, or give the person each dose as prescribed.  Talk about current events and plans for the day.  Try to keep the environment calm, quiet, and peaceful.  Make sure the person keeps follow-up visits with his or her health care provider. PREVENTION  Ways to prevent confusion:  Avoid alcohol.  Eat a balanced diet.  Get enough sleep.  Take medicine only as directed by your health care provider.  Do not become isolated. Spend time with other people and make plans for your days.  Keep careful watch on your blood sugar levels if you are diabetic. SEEK IMMEDIATE MEDICAL CARE IF:   You develop severe headaches, repeated vomiting, seizures, blackouts, or   slurred speech.  There is increasing confusion, weakness, numbness, restlessness, or personality changes.  You develop a loss of balance, have marked dizziness, feel uncoordinated, or fall.  You have delusions, hallucinations, or develop severe anxiety.  Your family members think you need to be rechecked.   This information is not intended to replace advice given  to you by your health care provider. Make sure you discuss any questions you have with your health care provider.   Document Released: 10/22/2004 Document Revised: 10/05/2014 Document Reviewed: 10/20/2013 Elsevier Interactive Patient Education 2016 [employer]  

## 2015-09-25 NOTE — [place] FL2 (Signed)
[place] MEDICAID FL2 LEVEL OF CARE SCREENING TOOL     IDENTIFICATION  Patient Name: Allen Vega. Birthdate: May 24, 1925 Sex: male Admission Date (Current Location): 09/24/2015  [facility] and [place] Number:  Producer, television/film/video and Address:  [facility],  [address], [place] [address]      Provider Number: [id number]  Attending Physician Name and Address:  Elease Etienne, MD  Relative Name and Phone Number:       Current Level of Care: Hospital Recommended Level of Care: Assisted Living Facility Prior Approval Number:    Date Approved/Denied:   PASRR Number:    Discharge Plan: Other (Comment) (Assisted Living Facility)    Current Diagnoses: Patient Active Problem List   Diagnosis Date Noted  . Nephrolithiasis 09/24/2015  . Acute encephalopathy 09/19/2014  . Altered mental status 09/18/2014  . Dementia 09/18/2014  . UTI (lower urinary tract infection) 09/18/2014  . Encephalopathy 09/18/2014  . Memory loss 08/28/2013  . 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    Orientation RESPIRATION BLADDER Height & Weight    Self, Situation, Place  Normal Continent  (180.3 cm)    BEHAVIORAL SYMPTOMS/MOOD NEUROLOGICAL BOWEL NUTRITION STATUS  Other (Comment) (No behaviors during hospitalization.)   Continent Diet  AMBULATORY STATUS COMMUNICATION OF NEEDS Skin   Limited Assist Verbally Other (Comment)                       Personal Care Assistance Level of Assistance  Bathing, Feeding, Dressing Bathing Assistance: Limited assistance Feeding assistance: Independent Dressing Assistance: Limited assistance     Functional Limitations Info  Sight, Hearing, Speech Sight Info: Adequate Hearing Info: Adequate Speech Info: Adequate    SPECIAL CARE FACTORS FREQUENCY  PT (By licensed PT)     PT Frequency: PT needed at ALF .              Contractures Contractures Info: Not present    Additional Factors Info  Code Status Code  Status Info: Full Code             Current Medications (09/25/2015):  This is the current hospital active medication list Current Facility-Administered Medications  Medication Dose Route Frequency Provider Last Rate Last Dose  . 0.9 %  sodium chloride infusion  250 mL Intravenous PRN Penny Pia, MD 10 mL/hr at 09/24/15 1238 250 mL at 09/24/15 1238  . acetaminophen (TYLENOL) tablet 650 mg  650 mg Oral Q6H PRN Penny Pia, MD       Or  . acetaminophen (TYLENOL) suppository 650 mg  650 mg Rectal Q6H PRN Penny Pia, MD      . aspirin chewable tablet 81 mg  81 mg Oral QHS Penny Pia, MD   81 mg at 09/24/15 2037  . cefTRIAXone (ROCEPHIN) 1 g in dextrose 5 % 50 mL IVPB  1 g Intravenous Q24H Adalberto Cole, RPH   1 g at 09/25/15 0742  . divalproex (DEPAKOTE) DR tablet 375 mg  375 mg Oral BID Penny Pia, MD   375 mg at 09/25/15 0933  . docusate sodium (COLACE) capsule 100 mg  100 mg Oral QHS Penny Pia, MD   100 mg at 09/24/15 2037  . fluticasone (FLONASE) 50 MCG/ACT nasal spray 2 spray  2 spray Each Nare Daily Penny Pia, MD   2 spray at 09/25/15 0934  . lisinopril (PRINIVIL,ZESTRIL) tablet 2.5 mg  2.5 mg Oral Daily Penny Pia, MD   2.5 mg at 09/25/15 0933  .  memantine (NAMENDA) tablet 10 mg  10 mg Oral BID [name], MD   10 mg at 09/25/15 0933  . ondansetron (ZOFRAN) tablet 4 mg  4 mg Oral Q8H PRN [name], MD      . pravastatin (PRAVACHOL) tablet 10 mg  10 mg Oral Daily [name], MD   10 mg at 09/25/15 0936  . sodium chloride 0.9 % injection 3 mL  3 mL Intravenous Q12H [name], MD   3 mL at 09/24/15 2039  . sodium chloride 0.9 % injection 3 mL  3 mL Intravenous PRN [name], MD      . venlafaxine XR (EFFEXOR-XR) 24 hr capsule 37.5 mg  37.5 mg Oral Daily [name], MD   37.5 mg at 09/25/15 [id number]     Discharge Medications:  Medication List    TAKE these medications       ADULT SUPPOSITORY RE  Place 1 applicator rectally daily as needed  (constipation).     aspirin 81 MG tablet  Take 81 mg by mouth at bedtime.     cetirizine 10 MG tablet  Commonly known as: ZYRTEC  Take 10 mg by mouth daily.     divalproex 250 MG DR tablet  Commonly known as: DEPAKOTE  Take 375 mg by mouth 2 (two) times daily.     docusate sodium 100 MG capsule  Commonly known as: COLACE  Take 100 mg by mouth at bedtime.     fluticasone 50 MCG/ACT nasal spray  Commonly known as: FLONASE  Place 2 sprays into both nostrils daily.     lisinopril 2.5 MG tablet  Commonly known as: PRINIVIL,ZESTRIL  Take 2.5 mg by mouth daily.     memantine 10 MG tablet  Commonly known as: NAMENDA  Take 1 tablet (10 mg total) by mouth 2 (two) times daily.     multivitamin with minerals Tabs tablet  Take 1 tablet by mouth daily.     ondansetron 4 MG tablet  Commonly known as: ZOFRAN  Take 4 mg by mouth every 8 (eight) hours as needed for nausea or vomiting.     pravastatin 10 MG tablet  Commonly known as: PRAVACHOL  Take 10 mg by mouth daily.     traMADol 50 MG tablet  Commonly known as: ULTRAM  Take 1 tablet (50 mg total) by mouth 2 (two) times daily as needed for moderate pain or severe pain (pain).     TYLENOL EXTRA STRENGTH PO  Take 500 mg by mouth 3 (three) times daily as needed.     venlafaxine XR 37.5 MG 24 hr capsule  Commonly known as: EFFEXOR-XR  Take 37.5 mg by mouth daily.          Please see discharge summary for a list of discharge medications.  Relevant Imaging Results:  Relevant Lab Results:   Additional Information    Kiaraliz Rafuse, Dickey GaveJamie Lee, LCSW

## 2015-09-25 NOTE — Progress Notes (Signed)
OT Cancellation Note  Patient Details Name: Allen ChestnutRichard F Fiallo Jr. MRN: [id number] DOB: Jun 16, 1925   Cancelled Treatment:    Reason Eval/Treat Not Completed: Other (comment).  Noted plan is for pt to return to [facility] this pm.  SW talked to facility and they can provide needed assistance. Will sign off.  Timira Bieda 09/25/2015, 3:03 PM  Marica OtterMaryellen Jere Vanburen, OTR/L [phone] 09/25/2015

## 2015-09-25 NOTE — Progress Notes (Signed)
Pt is ready for d/c back to [facility]. Pt / spouse are in agreement with this plan. NSG reviewed d/c summary, avs . No scripts. Clinical info sent to ALF for review prior to d/c. Continued PT needed at ALF. Pt was receiving PT services at ALF and required assistance with ADL's prior to hospitalization.  PTAR transport required. Spouse is aware that out of pocket costs may be associated with PTAR transport.  Cori RazorJamie Carli Lefevers LCSW [phone]

## 2015-09-25 NOTE — Discharge Summary (Addendum)
Physician Discharge Summary  Allen Chestnutichard F Afshar Jr. [id number] DOB: August 26, 1925 DOA: 09/24/2015  PCP: Allen Vega, HENRY, MD  Admit date: 09/24/2015 Discharge date: 09/25/2015  Time spent: Greater than 30 minutes  Recommendations for Outpatient Follow-up:  1. Dr. Florentina JennyHenry Vega, PCP in 3 days with repeat labs (CBC & BMP). Please follow final blood culture results that were sent from the hospital on 09/24/15. 2. Dr. [name], [facility] regarding outpatient follow-up of UPJ stone management.  3. Rolling walker with 5 inch wheels  Discharge Diagnoses:  Active Problems:   Altered mental status   Dementia   Nephrolithiasis   Discharge Condition: Improved & Stable  Diet recommendation:  heart healthy diet  There were no vitals filed for this visit.  History of present illness:  81 year old male with history of dementia, chronic kidney disease, anxiety, depression, multifactorial gait disorder, [name] on 09/24/15 with complaints of altered mental status and fever of 101.23F. She recently underwent surgery on his left great toenail and right second toenail for what appears to be a fungal infection. As per spouse, he was not started on any new opioids or medications. Patient apparently developed worsening confusion over 2 days prior to admission. This was associated with agitation. As per family, there was a recent outbreak of neurovirus at the facility and family were informed that he had contracted the virus and had been quarantined to his room, restricted diet-clear liquids and had not had any contact with other people at his facility or left his room for several days which spouse felt could have accounted for acute worsening of his mental status. In the ED, febrile to 101.23F and remainder of vital signs were stable. No leukocytosis. Urine microscopy showed large blood but no evidence of infection with negative leukocytes and  nitrites, 0-5 WBCs and no bacteria. CT demonstrated 1.9 x 1.5 cm right UPJ stone without evidence of obstruction with no associated hydronephrosis or stranding. No obstructing ureteral stone seen. Hospitalist admission was requested.  Hospital Course:   Acute encephalopathy - Definite etiology is unclear but may be multifactorial from fevers of undetermined origin, recent norovirus infection and change in his usual environment where he was secluded in his room. - No focal deficits. As per spouse, mental status has returned to baseline since yesterday. - Valproate level was low at 34. TSH normal approximately a year ago. - Resolved.  Fever - unclear source. ? Related to recent Norovirus infection. Urine microscopy unimpressive for infection. UCx Negative. Blood cultures pending. CXR report as below but no respiratory symptoms making PNA less likely. No further fevers- resolved.  1.9 cm right UPJ stone  - Urology was consulted. As per their input, no evidence of UTI on UA or obstructing nephrolithiasis on CT. Patient is asymptomatic from UTI or obstructing stone standpoint. They did not recommend any inpatient surgical intervention. He was empirically treated with IV Rocephin of which she received 3 doses while urine cultures were pending. Urine culture has returned normal. Patient will be discharged without antibiotics. Outpatient follow-up with urology for further evaluation and management of the kidney stone. Asymptomatic of same at this time.  Hypokalemia - Replaced prior to discharge. Follow BMP in a couple of days.  Thrombocytopenia - Unclear etiology.? Related to recent fever and viral infection. Stable over the last 24 hours. No bleeding reported. Follow CBC in a couple of days at ALF.  Dementia - Mental status now at baseline. Continue home medications.  Reported history of anxiety and depression - Stable. Continue home medications at discharge   Mild anemia - Stable   Status  post left great toenail and right second toenail surgery - Follow-up with outpatient podiatry as needed. No clinical evidence of infection at those sites.  Aneurysmal dilatation of ascending thoracic aorta to 4.5 cm - Seen on CT. Outpatient follow-up testing necessary.   Consultations:  Urology  Procedures:  None     Discharge Exam:  Complaints: Denies complaints. As per spouse at bedside, mental status is back to baseline or even better than it has been for the last several days. As per RN, no acute issues reported. No nausea, vomiting, diarrhea or pain   Filed Vitals:   09/24/15 1537 09/24/15 2116 09/25/15 0629 09/25/15 0933  BP: 122/48 113/45 126/47 140/44  Pulse: 58 53 54   Temp: 97.7 F (36.5 C) 98.8 F (37.1 C) 98.4 F (36.9 C)   TempSrc: Oral Oral Oral   Resp: Height:      SpO2: 99% 98% 98%     General exam: Pleasant elderly male sitting up comfortably in bed.  Respiratory system: Clear. No increased work of breathing. Cardiovascular system: S1 & S2 heard, RRR. No JVD, murmurs, gallops, clicks or pedal edema. Gastrointestinal system: Abdomen is nondistended, soft and nontender. Normal bowel sounds heard. Central nervous system: Alert and oriented to self and partly to place . No focal neurological deficits. Extremities: Symmetric 5 x 5 power. Left great toenail seems to have been completley removed. Dried blood at bed without acute findings. Right 2nd toe nail seems to have been partially removed and dried blood at bed without acute findings.  Discharge Instructions      Discharge Instructions    Call MD for:  difficulty breathing, headache or visual disturbances    Complete by:  As directed      Call MD for:  extreme fatigue    Complete by:  As directed      Call MD for:  persistant dizziness or light-headedness    Complete by:  As directed      Call MD for:  persistant nausea and vomiting    Complete by:  As directed      Call MD for:   redness, tenderness, or signs of infection (pain, swelling, redness, odor or green/yellow discharge around incision site)    Complete by:  As directed      Call MD for:  severe uncontrolled pain    Complete by:  As directed      Call MD for:  temperature >100.4    Complete by:  As directed      Call MD for:    Complete by:  As directed   Worsening confusion or altered mental status.     Diet - low sodium heart healthy    Complete by:  As directed      Increase activity slowly    Complete by:  As directed             Medication List    TAKE these medications        ADULT SUPPOSITORY RE  Place 1 applicator rectally daily as needed (constipation).     aspirin 81 MG tablet  Take 81 mg by mouth at bedtime.     cetirizine 10 MG tablet  Commonly known as:  ZYRTEC  Take 10 mg by mouth daily.     divalproex 250 MG DR tablet  Commonly known as:  DEPAKOTE  Take 375 mg by mouth 2 (two) times daily.     docusate sodium 100 MG capsule  Commonly known as:  COLACE  Take 100 mg by mouth at bedtime.     fluticasone 50 MCG/ACT nasal spray  Commonly known as:  FLONASE  Place 2 sprays into both nostrils daily.     lisinopril 2.5 MG tablet  Commonly known as:  PRINIVIL,ZESTRIL  Take 2.5 mg by mouth daily.     memantine 10 MG tablet  Commonly known as:  NAMENDA  Take 1 tablet (10 mg total) by mouth 2 (two) times daily.     multivitamin with minerals Tabs tablet  Take 1 tablet by mouth daily.     ondansetron 4 MG tablet  Commonly known as:  ZOFRAN  Take 4 mg by mouth every 8 (eight) hours as needed for nausea or vomiting.     pravastatin 10 MG tablet  Commonly known as:  PRAVACHOL  Take 10 mg by mouth daily.     traMADol 50 MG tablet  Commonly known as:  ULTRAM  Take 1 tablet (50 mg total) by mouth 2 (two) times daily as needed for moderate pain or severe pain (pain).     TYLENOL EXTRA STRENGTH PO  Take 500 mg by mouth 3 (three) times daily as needed.     venlafaxine XR  37.5 MG 24 hr capsule  Commonly known as:  EFFEXOR-XR  Take 37.5 mg by mouth daily.       Follow-up Information    Follow up with [facility].   Specialty:  Assisted Living Facility   Contact information:   [address] [place] [address] [phone]      Follow up with Allen Jenny, MD. Schedule an appointment as soon as possible for a visit in 3 days.   Specialty:  Family Medicine   Why:  To be seen with repeat labs (CBC & BMP).   Contact information:   [address] [place] [address] [phone]       Schedule an appointment as soon as possible for a visit with Valetta Fuller, MD.   Specialty:  Urology   Why:  Regarding follow-up of kidney stone.   Contact information:   [address] [place] [address] [phone]        The results of significant diagnostics from this hospitalization (including imaging, microbiology, ancillary and laboratory) are listed below for reference.    Significant Diagnostic Studies: Ct Abdomen Pelvis Wo Contrast  09/24/2015  CLINICAL DATA:  Acute onset of generalized abdominal pain and fever. Red blood cells in the urine. Initial encounter. EXAM: CT ABDOMEN AND PELVIS WITHOUT CONTRAST TECHNIQUE: Multidetector CT imaging of the abdomen and pelvis was performed following the standard protocol without IV contrast. COMPARISON:  CT of the abdomen and pelvis from 09/13/2008 FINDINGS: Mild bibasilar atelectasis or scarring is noted. There is aneurysmal dilatation of the ascending thoracic aorta to 4.5 cm in AP dimension. Diffuse coronary artery calcifications are seen. The liver and spleen are unremarkable in appearance. The gallbladder is within normal limits. The pancreas and adrenal glands are unremarkable. There is a large 1.9 x 1.5 cm stone at the right renal pelvis, with surrounding soft tissue inflammation. This may reflect some degree of infection. Mild nonspecific perinephric  stranding is noted bilaterally. A small right renal cyst is seen, measuring 1.2 cm. No additional renal or ureteral stones are identified.  No significant hydronephrosis is seen. No free fluid is identified. The small bowel is unremarkable in appearance. The stomach is within normal limits. No acute vascular abnormalities are seen. Scattered calcification is noted along the abdominal aorta and its branches. The appendix is not definitely characterized; there is no evidence for appendicitis. Scattered diverticulosis is noted along the sigmoid colon, without evidence of diverticulitis. The rectum is mildly distended with stool, measuring up to 7.8 cm in diameter. The bladder is mildly distended. Apparent bladder wall irregularity and surrounding soft tissue inflammation raises concern for cystitis, though a mass cannot be excluded. The remaining prostate is normal in size. No inguinal lymphadenopathy is seen. No acute osseous abnormalities are identified. IMPRESSION: 1. Apparent bladder wall irregularity and surrounding soft tissue inflammation raises concern for cystitis. Given the patient's red blood cells in the urine, a mass cannot be entirely excluded. If the patient's symptoms persist, cystoscopy could be considered. 2. Large 1.9 x 1.5 cm stone at the right renal pelvis, with surrounding soft inflammation. This may reflect some degree of infection. No evidence of hydronephrosis. 3. Small right renal cyst seen. 4. Mild bibasilar atelectasis or scarring noted. 5. Aneurysmal dilatation of the ascending thoracic aorta to 4.5 cm in AP dimension. This tends to remain within normal limits given the patient's age. 6. Diffuse coronary artery calcifications seen 7. Scattered calcification along the abdominal aorta and its branches. 8. Scattered diverticulosis along the sigmoid colon, without evidence of diverticulitis. 9. Rectum mildly distended with stool, measuring up to 7.8 cm in diameter acute. Electronically Signed    By: Roanna Raider M.D.   On: 09/24/2015 06:15   Dg Chest Port 1 View  09/24/2015  CLINICAL DATA:  Acute onset of fever and altered mental status. Initial encounter. EXAM: PORTABLE CHEST 1 VIEW COMPARISON:  Chest radiograph performed 02/28/2010 FINDINGS: The lungs are well-aerated. Vascular congestion is noted. Increased interstitial markings raise concern for mild interstitial edema, though mild pneumonia might have a similar appearance, given the patient's symptoms. There is no evidence of pleural effusion or pneumothorax. The cardiomediastinal silhouette is borderline normal in size. No acute osseous abnormalities are seen. IMPRESSION: Vascular congestion noted. Increased interstitial markings raise concern for mild interstitial edema, though mild pneumonia might have a similar appearance, given the patient's symptoms. Electronically Signed   By: Roanna Raider M.D.   On: 09/24/2015 03:22    Microbiology: Recent Results (from the past 240 hour(s))  Urine culture     Status: None   Collection Time: 09/24/15  3:30 AM  Result Value Ref Range Status   Specimen Description URINE, CATHETERIZED  Final   Special Requests NONE  Final   Culture   Final    NO GROWTH 1 DAY Performed at [facility]    Report Status 09/25/2015 FINAL  Final     Labs: Basic Metabolic Panel:  Recent Labs Lab 09/24/15 0308 09/25/15 0410  NA 146* 144  K 3.4* 3.3*  CL 112* 111  CO2 25 27  GLUCOSE 109* 91  BUN 17 18  CREATININE 0.79 0.61  CALCIUM 8.5* 8.3*   Liver Function Tests:  Recent Labs Lab 09/24/15 0308  AST 40  ALT 34  ALKPHOS 41  BILITOT 0.8  PROT 5.9*  ALBUMIN 3.2*   No results for input(s): LIPASE, AMYLASE in the last 168 hours. No results for input(s): AMMONIA in the last 168 hours. CBC:  Recent Labs Lab 09/24/15 0308 09/25/15 0410  WBC 5.0 5.2  NEUTROABS 3.4  --  HGB 12.4* 12.8*  HCT 36.0* 38.2*  MCV 91.4 91.6  PLT 83* 85*   Cardiac Enzymes: No results for  input(s): CKTOTAL, CKMB, CKMBINDEX, TROPONINI in the last 168 hours. BNP: BNP (last 3 results) No results for input(s): BNP in the last 8760 hours.  ProBNP (last 3 results) No results for input(s): PROBNP in the last 8760 hours.  CBG: No results for input(s): GLUCAP in the last 168 hours.   Discussed extensively with patient's spouse at bedside. Updated care and answered questions. PT evaluation noted. Discussed with clinical social worker who indicated that patient's prior assisted living facility will be able to provide the level of care that was recommended by PT.   Signed:  Marcellus Scott, MD, FACP, FHM. [facility] Pager [phone]  If 7PM-7AM, please contact night-coverage [email or web address] Password [email or web address] 09/25/2015, 1:45 PM

## 2015-09-25 NOTE — Evaluation (Signed)
Physical Therapy Evaluation Patient Details Name: Allen ChestnutRichard F Honeyman Jr. MRN: [id number] DOB: 1925/05/27 Today's Date: 09/25/2015   History of Present Illness  81 y.o. male with history of dementia, chronic kidney disease, anxiety, depression and multifactorial gait disorder who presented from ALF to the hospital with altered mental status. CT which demonstrates a large 1.9 cm stone within the right renal pelvis with no hydronephrosis or significant stranding  Clinical Impression  Pt admitted with above diagnosis. Pt currently with functional limitations due to the deficits listed below (see PT Problem List).  Pt will benefit from skilled PT to increase their independence and safety with mobility to allow discharge to the venue listed below.  Pt requiring min assist and very fearful of falling.  Pt states he feels "off balance" with ambulation which limited distance.  Per chart review, pt from ALF with spouse, and pt may need higher level of care if ALF cannot provide current assist level.     Follow Up Recommendations Supervision/Assistance - 24 hour;SNF    Equipment Recommendations  Rolling walker with 5" wheels    Recommendations for Other Services       Precautions / Restrictions Precautions Precautions: Fall      Mobility  Bed Mobility Overal bed mobility: Needs Assistance Bed Mobility: Supine to Sit     Supine to sit: Supervision;HOB elevated     General bed mobility comments: required HOB elevated and used rail  Transfers Overall transfer level: Needs assistance Equipment used: Rolling walker (2 wheeled) Transfers: Sit to/from Stand Sit to Stand: Min assist         General transfer comment: verbal cues for safe technique, assist to rise and steady  Ambulation/Gait Ambulation/Gait assistance: Min assist Ambulation Distance (Feet): 18 Feet Assistive device: Rolling walker (2 wheeled) Gait Pattern/deviations: Step-through pattern;Decreased stride  length;Shuffle;Narrow base of support;Trunk flexed     General Gait Details: verbal cues for bigger steps however pt continually stating "feeling off balance" and worried about falls despite +2 help and gait belt, also had to urinate so used urinal quickly once back into room (rehab tech assisted as pt needed both UEs on RW for balance)  Stairs            Wheelchair Mobility    Modified Rankin (Stroke Patients Only)       Balance Overall balance assessment: Needs assistance         Standing balance support: Bilateral upper extremity supported;During functional activity Standing balance-Leahy Scale: Poor Standing balance comment: requires bil UE support                             Pertinent Vitals/Pain Pain Assessment: No/denies pain    Home Living Family/patient expects to be discharged to:: Assisted living                      Prior Function Level of Independence: Independent with assistive device(s)         Comments: pt poor historian, likely ambulates with an assistive device, unable to recall if he requires assist for ADLs     Hand Dominance        Extremity/Trunk Assessment   Upper Extremity Assessment: Generalized weakness           Lower Extremity Assessment: Generalized weakness         Communication   Communication: No difficulties  Cognition Arousal/Alertness: Awake/alert Behavior During Therapy: WFL for tasks assessed/performed Overall  Cognitive Status: No family/caregiver present to determine baseline cognitive functioning (hx dementia, alert and orientated however possibly presents with increased confusion, unknown baseline)                      General Comments      Exercises        Assessment/Plan    PT Assessment Patient needs continued PT services  PT Diagnosis Difficulty walking   PT Problem List Decreased strength;Decreased balance;Decreased mobility;Decreased activity  tolerance;Decreased knowledge of use of DME  PT Treatment Interventions DME instruction;Gait training;Patient/family education;Functional mobility training;Therapeutic activities;Therapeutic exercise;Balance training   PT Goals (Current goals can be found in the Care Plan section) Acute Rehab PT Goals PT Goal Formulation: With patient Time For Goal Achievement: 10/02/15 Potential to Achieve Goals: Good    Frequency Min 3X/week   Barriers to discharge        Co-evaluation               End of Session Equipment Utilized During Treatment: Gait belt Activity Tolerance: Patient tolerated treatment well Patient left: in chair;with call bell/phone within reach;with chair alarm set Nurse Communication: Mobility status    Functional Assessment Tool Used: clinical judgement Functional Limitation: Mobility: Walking and moving around Mobility: Walking and Moving Around Current Status [phone]): At least 20 percent but less than 40 percent impaired, limited or restricted Mobility: Walking and Moving Around Goal Status [phone]): At least 1 percent but less than 20 percent impaired, limited or restricted    Time: 1040-1057 PT Time Calculation (min) (ACUTE ONLY): 17 min   Charges:   PT Evaluation $Initial PT Evaluation Tier I: 1 Procedure     PT G Codes:   PT G-Codes **NOT FOR INPATIENT CLASS** Functional Assessment Tool Used: clinical judgement Functional Limitation: Mobility: Walking and moving around Mobility: Walking and Moving Around Current Status (U9811): At least 20 percent but less than 40 percent impaired, limited or restricted Mobility: Walking and Moving Around Goal Status [phone]): At least 1 percent but less than 20 percent impaired, limited or restricted    Allen Vega 09/25/2015, 11:41 AM Allen Vega, PT, DPT 09/25/2015 Pager: [phone]

## 2015-09-26 DIAGNOSIS — R262 Difficulty in walking, not elsewhere classified: Secondary | ICD-10-CM | POA: Diagnosis not present

## 2015-09-26 DIAGNOSIS — R29898 Other symptoms and signs involving the musculoskeletal system: Secondary | ICD-10-CM | POA: Diagnosis not present

## 2015-09-26 DIAGNOSIS — R296 Repeated falls: Secondary | ICD-10-CM | POA: Diagnosis not present

## 2015-09-26 DIAGNOSIS — R2681 Unsteadiness on feet: Secondary | ICD-10-CM | POA: Diagnosis not present

## 2015-09-26 DIAGNOSIS — Z9181 History of falling: Secondary | ICD-10-CM | POA: Diagnosis not present

## 2015-09-29 LAB — CULTURE, BLOOD (ROUTINE X 2)
CULTURE: NO GROWTH
Culture: NO GROWTH

## 2015-09-30 DIAGNOSIS — R296 Repeated falls: Secondary | ICD-10-CM | POA: Diagnosis not present

## 2015-09-30 DIAGNOSIS — Z9181 History of falling: Secondary | ICD-10-CM | POA: Diagnosis not present

## 2015-09-30 DIAGNOSIS — R2681 Unsteadiness on feet: Secondary | ICD-10-CM | POA: Diagnosis not present

## 2015-09-30 DIAGNOSIS — R262 Difficulty in walking, not elsewhere classified: Secondary | ICD-10-CM | POA: Diagnosis not present

## 2015-09-30 DIAGNOSIS — R29898 Other symptoms and signs involving the musculoskeletal system: Secondary | ICD-10-CM | POA: Diagnosis not present

## 2015-10-01 DIAGNOSIS — Z79899 Other long term (current) drug therapy: Secondary | ICD-10-CM | POA: Diagnosis not present

## 2015-10-01 DIAGNOSIS — R269 Unspecified abnormalities of gait and mobility: Secondary | ICD-10-CM | POA: Diagnosis not present

## 2015-10-01 DIAGNOSIS — Z9181 History of falling: Secondary | ICD-10-CM | POA: Diagnosis not present

## 2015-10-01 DIAGNOSIS — R2681 Unsteadiness on feet: Secondary | ICD-10-CM | POA: Diagnosis not present

## 2015-10-01 DIAGNOSIS — R296 Repeated falls: Secondary | ICD-10-CM | POA: Diagnosis not present

## 2015-10-01 DIAGNOSIS — G309 Alzheimer's disease, unspecified: Secondary | ICD-10-CM | POA: Diagnosis not present

## 2015-10-01 DIAGNOSIS — R262 Difficulty in walking, not elsewhere classified: Secondary | ICD-10-CM | POA: Diagnosis not present

## 2015-10-01 DIAGNOSIS — R29898 Other symptoms and signs involving the musculoskeletal system: Secondary | ICD-10-CM | POA: Diagnosis not present

## 2015-10-02 DIAGNOSIS — Z9181 History of falling: Secondary | ICD-10-CM | POA: Diagnosis not present

## 2015-10-02 DIAGNOSIS — R29898 Other symptoms and signs involving the musculoskeletal system: Secondary | ICD-10-CM | POA: Diagnosis not present

## 2015-10-02 DIAGNOSIS — R2681 Unsteadiness on feet: Secondary | ICD-10-CM | POA: Diagnosis not present

## 2015-10-02 DIAGNOSIS — R296 Repeated falls: Secondary | ICD-10-CM | POA: Diagnosis not present

## 2015-10-02 DIAGNOSIS — R262 Difficulty in walking, not elsewhere classified: Secondary | ICD-10-CM | POA: Diagnosis not present

## 2015-10-03 DIAGNOSIS — R29898 Other symptoms and signs involving the musculoskeletal system: Secondary | ICD-10-CM | POA: Diagnosis not present

## 2015-10-03 DIAGNOSIS — Z9181 History of falling: Secondary | ICD-10-CM | POA: Diagnosis not present

## 2015-10-03 DIAGNOSIS — R296 Repeated falls: Secondary | ICD-10-CM | POA: Diagnosis not present

## 2015-10-03 DIAGNOSIS — R262 Difficulty in walking, not elsewhere classified: Secondary | ICD-10-CM | POA: Diagnosis not present

## 2015-10-03 DIAGNOSIS — R2681 Unsteadiness on feet: Secondary | ICD-10-CM | POA: Diagnosis not present

## 2015-10-04 DIAGNOSIS — Z9181 History of falling: Secondary | ICD-10-CM | POA: Diagnosis not present

## 2015-10-04 DIAGNOSIS — R2681 Unsteadiness on feet: Secondary | ICD-10-CM | POA: Diagnosis not present

## 2015-10-04 DIAGNOSIS — R29898 Other symptoms and signs involving the musculoskeletal system: Secondary | ICD-10-CM | POA: Diagnosis not present

## 2015-10-04 DIAGNOSIS — R262 Difficulty in walking, not elsewhere classified: Secondary | ICD-10-CM | POA: Diagnosis not present

## 2015-10-04 DIAGNOSIS — R296 Repeated falls: Secondary | ICD-10-CM | POA: Diagnosis not present

## 2015-10-07 DIAGNOSIS — R296 Repeated falls: Secondary | ICD-10-CM | POA: Diagnosis not present

## 2015-10-07 DIAGNOSIS — R2681 Unsteadiness on feet: Secondary | ICD-10-CM | POA: Diagnosis not present

## 2015-10-07 DIAGNOSIS — Z9181 History of falling: Secondary | ICD-10-CM | POA: Diagnosis not present

## 2015-10-07 DIAGNOSIS — R29898 Other symptoms and signs involving the musculoskeletal system: Secondary | ICD-10-CM | POA: Diagnosis not present

## 2015-10-07 DIAGNOSIS — R262 Difficulty in walking, not elsewhere classified: Secondary | ICD-10-CM | POA: Diagnosis not present

## 2015-10-08 DIAGNOSIS — R296 Repeated falls: Secondary | ICD-10-CM | POA: Diagnosis not present

## 2015-10-08 DIAGNOSIS — Z9181 History of falling: Secondary | ICD-10-CM | POA: Diagnosis not present

## 2015-10-08 DIAGNOSIS — R29898 Other symptoms and signs involving the musculoskeletal system: Secondary | ICD-10-CM | POA: Diagnosis not present

## 2015-10-08 DIAGNOSIS — Z79899 Other long term (current) drug therapy: Secondary | ICD-10-CM | POA: Diagnosis not present

## 2015-10-08 DIAGNOSIS — R262 Difficulty in walking, not elsewhere classified: Secondary | ICD-10-CM | POA: Diagnosis not present

## 2015-10-08 DIAGNOSIS — R2681 Unsteadiness on feet: Secondary | ICD-10-CM | POA: Diagnosis not present

## 2015-10-09 DIAGNOSIS — R296 Repeated falls: Secondary | ICD-10-CM | POA: Diagnosis not present

## 2015-10-09 DIAGNOSIS — R262 Difficulty in walking, not elsewhere classified: Secondary | ICD-10-CM | POA: Diagnosis not present

## 2015-10-09 DIAGNOSIS — Z9181 History of falling: Secondary | ICD-10-CM | POA: Diagnosis not present

## 2015-10-09 DIAGNOSIS — R2681 Unsteadiness on feet: Secondary | ICD-10-CM | POA: Diagnosis not present

## 2015-10-09 DIAGNOSIS — R29898 Other symptoms and signs involving the musculoskeletal system: Secondary | ICD-10-CM | POA: Diagnosis not present

## 2015-10-10 DIAGNOSIS — R262 Difficulty in walking, not elsewhere classified: Secondary | ICD-10-CM | POA: Diagnosis not present

## 2015-10-10 DIAGNOSIS — R296 Repeated falls: Secondary | ICD-10-CM | POA: Diagnosis not present

## 2015-10-10 DIAGNOSIS — R2681 Unsteadiness on feet: Secondary | ICD-10-CM | POA: Diagnosis not present

## 2015-10-10 DIAGNOSIS — Z9181 History of falling: Secondary | ICD-10-CM | POA: Diagnosis not present

## 2015-10-10 DIAGNOSIS — R29898 Other symptoms and signs involving the musculoskeletal system: Secondary | ICD-10-CM | POA: Diagnosis not present

## 2015-10-14 DIAGNOSIS — R2681 Unsteadiness on feet: Secondary | ICD-10-CM | POA: Diagnosis not present

## 2015-10-14 DIAGNOSIS — Z9181 History of falling: Secondary | ICD-10-CM | POA: Diagnosis not present

## 2015-10-14 DIAGNOSIS — R296 Repeated falls: Secondary | ICD-10-CM | POA: Diagnosis not present

## 2015-10-14 DIAGNOSIS — R29898 Other symptoms and signs involving the musculoskeletal system: Secondary | ICD-10-CM | POA: Diagnosis not present

## 2015-10-14 DIAGNOSIS — R262 Difficulty in walking, not elsewhere classified: Secondary | ICD-10-CM | POA: Diagnosis not present

## 2015-10-15 DIAGNOSIS — L709 Acne, unspecified: Secondary | ICD-10-CM | POA: Diagnosis not present

## 2015-10-15 DIAGNOSIS — Z993 Dependence on wheelchair: Secondary | ICD-10-CM | POA: Diagnosis not present

## 2015-10-16 DIAGNOSIS — Z9181 History of falling: Secondary | ICD-10-CM | POA: Diagnosis not present

## 2015-10-16 DIAGNOSIS — R2681 Unsteadiness on feet: Secondary | ICD-10-CM | POA: Diagnosis not present

## 2015-10-16 DIAGNOSIS — N390 Urinary tract infection, site not specified: Secondary | ICD-10-CM | POA: Diagnosis not present

## 2015-10-16 DIAGNOSIS — R296 Repeated falls: Secondary | ICD-10-CM | POA: Diagnosis not present

## 2015-10-16 DIAGNOSIS — R262 Difficulty in walking, not elsewhere classified: Secondary | ICD-10-CM | POA: Diagnosis not present

## 2015-10-16 DIAGNOSIS — R29898 Other symptoms and signs involving the musculoskeletal system: Secondary | ICD-10-CM | POA: Diagnosis not present

## 2015-10-17 DIAGNOSIS — R2681 Unsteadiness on feet: Secondary | ICD-10-CM | POA: Diagnosis not present

## 2015-10-17 DIAGNOSIS — R29898 Other symptoms and signs involving the musculoskeletal system: Secondary | ICD-10-CM | POA: Diagnosis not present

## 2015-10-17 DIAGNOSIS — Z9181 History of falling: Secondary | ICD-10-CM | POA: Diagnosis not present

## 2015-10-17 DIAGNOSIS — R262 Difficulty in walking, not elsewhere classified: Secondary | ICD-10-CM | POA: Diagnosis not present

## 2015-10-17 DIAGNOSIS — R296 Repeated falls: Secondary | ICD-10-CM | POA: Diagnosis not present

## 2015-10-18 DIAGNOSIS — R262 Difficulty in walking, not elsewhere classified: Secondary | ICD-10-CM | POA: Diagnosis not present

## 2015-10-18 DIAGNOSIS — R29898 Other symptoms and signs involving the musculoskeletal system: Secondary | ICD-10-CM | POA: Diagnosis not present

## 2015-10-18 DIAGNOSIS — R296 Repeated falls: Secondary | ICD-10-CM | POA: Diagnosis not present

## 2015-10-18 DIAGNOSIS — R2681 Unsteadiness on feet: Secondary | ICD-10-CM | POA: Diagnosis not present

## 2015-10-18 DIAGNOSIS — Z9181 History of falling: Secondary | ICD-10-CM | POA: Diagnosis not present

## 2015-10-19 ENCOUNTER — Emergency Department (HOSPITAL_COMMUNITY): Payer: Medicare Other

## 2015-10-19 ENCOUNTER — Encounter (HOSPITAL_COMMUNITY): Payer: Self-pay | Admitting: Emergency Medicine

## 2015-10-19 ENCOUNTER — Emergency Department (HOSPITAL_COMMUNITY)
Admission: EM | Admit: 2015-10-19 | Discharge: 2015-10-19 | Disposition: A | Discharge status: Home / Self Care | Payer: Medicare Other | Attending: Emergency Medicine | Admitting: Emergency Medicine

## 2015-10-19 DIAGNOSIS — N182 Chronic kidney disease, stage 2 (mild): Secondary | ICD-10-CM | POA: Insufficient documentation

## 2015-10-19 DIAGNOSIS — F0391 Unspecified dementia with behavioral disturbance: Secondary | ICD-10-CM | POA: Diagnosis not present

## 2015-10-19 DIAGNOSIS — S299XXA Unspecified injury of thorax, initial encounter: Secondary | ICD-10-CM | POA: Diagnosis not present

## 2015-10-19 DIAGNOSIS — W19XXXA Unspecified fall, initial encounter: Secondary | ICD-10-CM

## 2015-10-19 DIAGNOSIS — F329 Major depressive disorder, single episode, unspecified: Secondary | ICD-10-CM | POA: Insufficient documentation

## 2015-10-19 DIAGNOSIS — Z8719 Personal history of other diseases of the digestive system: Secondary | ICD-10-CM | POA: Insufficient documentation

## 2015-10-19 DIAGNOSIS — S79912A Unspecified injury of left hip, initial encounter: Secondary | ICD-10-CM | POA: Insufficient documentation

## 2015-10-19 DIAGNOSIS — N390 Urinary tract infection, site not specified: Secondary | ICD-10-CM | POA: Insufficient documentation

## 2015-10-19 DIAGNOSIS — Y998 Other external cause status: Secondary | ICD-10-CM | POA: Diagnosis not present

## 2015-10-19 DIAGNOSIS — Y9289 Other specified places as the place of occurrence of the external cause: Secondary | ICD-10-CM | POA: Insufficient documentation

## 2015-10-19 DIAGNOSIS — Y9389 Activity, other specified: Secondary | ICD-10-CM | POA: Insufficient documentation

## 2015-10-19 DIAGNOSIS — F411 Generalized anxiety disorder: Secondary | ICD-10-CM | POA: Insufficient documentation

## 2015-10-19 DIAGNOSIS — W06XXXA Fall from bed, initial encounter: Secondary | ICD-10-CM | POA: Insufficient documentation

## 2015-10-19 DIAGNOSIS — E785 Hyperlipidemia, unspecified: Secondary | ICD-10-CM | POA: Diagnosis not present

## 2015-10-19 DIAGNOSIS — Z8679 Personal history of other diseases of the circulatory system: Secondary | ICD-10-CM | POA: Diagnosis not present

## 2015-10-19 DIAGNOSIS — Z7951 Long term (current) use of inhaled steroids: Secondary | ICD-10-CM | POA: Diagnosis not present

## 2015-10-19 DIAGNOSIS — Z79899 Other long term (current) drug therapy: Secondary | ICD-10-CM | POA: Diagnosis not present

## 2015-10-19 DIAGNOSIS — R42 Dizziness and giddiness: Secondary | ICD-10-CM | POA: Diagnosis not present

## 2015-10-19 DIAGNOSIS — Z7982 Long term (current) use of aspirin: Secondary | ICD-10-CM | POA: Diagnosis not present

## 2015-10-19 DIAGNOSIS — Z8669 Personal history of other diseases of the nervous system and sense organs: Secondary | ICD-10-CM | POA: Insufficient documentation

## 2015-10-19 DIAGNOSIS — M25552 Pain in left hip: Secondary | ICD-10-CM | POA: Diagnosis not present

## 2015-10-19 DIAGNOSIS — Z8739 Personal history of other diseases of the musculoskeletal system and connective tissue: Secondary | ICD-10-CM | POA: Diagnosis not present

## 2015-10-19 LAB — CBC WITH DIFFERENTIAL/PLATELET
BASOS ABS: 0.1 10*3/uL (ref 0.0–0.1)
Basophils Relative: 1 %
EOS ABS: 0.4 10*3/uL (ref 0.0–0.7)
Eosinophils Relative: 8 %
HEMATOCRIT: 42.8 % (ref 39.0–52.0)
HEMOGLOBIN: 14 g/dL (ref 13.0–17.0)
LYMPHS PCT: 24 %
Lymphs Abs: 1.3 10*3/uL (ref 0.7–4.0)
MCH: 30.2 pg (ref 26.0–34.0)
MCHC: 32.7 g/dL (ref 30.0–36.0)
MCV: 92.4 fL (ref 78.0–100.0)
MONOS PCT: 15 %
Monocytes Absolute: 0.8 10*3/uL (ref 0.1–1.0)
NEUTROS PCT: 52 %
Neutro Abs: 3 10*3/uL (ref 1.7–7.7)
Platelets: 105 10*3/uL — ABNORMAL LOW (ref 150–400)
RBC: 4.63 MIL/uL (ref 4.22–5.81)
RDW: 14.6 % (ref 11.5–15.5)
WBC: 5.6 10*3/uL (ref 4.0–10.5)

## 2015-10-19 LAB — URINALYSIS, ROUTINE W REFLEX MICROSCOPIC
Bilirubin Urine: NEGATIVE
Glucose, UA: NEGATIVE mg/dL
Ketones, ur: NEGATIVE mg/dL
NITRITE: POSITIVE — AB
PH: 5.5 (ref 5.0–8.0)
PROTEIN: 100 mg/dL — AB
SPECIFIC GRAVITY, URINE: 1.026 (ref 1.005–1.030)

## 2015-10-19 LAB — TYPE AND SCREEN
ABO/RH(D): A POS
ANTIBODY SCREEN: NEGATIVE

## 2015-10-19 LAB — COMPREHENSIVE METABOLIC PANEL
ALBUMIN: 3.5 g/dL (ref 3.5–5.0)
ALK PHOS: 50 U/L (ref 38–126)
ALT: 18 U/L (ref 17–63)
ANION GAP: 8 (ref 5–15)
AST: 27 U/L (ref 15–41)
BILIRUBIN TOTAL: 0.9 mg/dL (ref 0.3–1.2)
BUN: 16 mg/dL (ref 6–20)
CALCIUM: 8.7 mg/dL — AB (ref 8.9–10.3)
CO2: 26 mmol/L (ref 22–32)
Chloride: 110 mmol/L (ref 101–111)
Creatinine, Ser: 0.74 mg/dL (ref 0.61–1.24)
GFR calc Af Amer: >60 mL/min (ref >60)
GLUCOSE: 89 mg/dL (ref 65–99)
Potassium: 3.9 mmol/L (ref 3.5–5.1)
Sodium: 144 mmol/L (ref 135–145)
TOTAL PROTEIN: 6.5 g/dL (ref 6.5–8.1)

## 2015-10-19 LAB — PROTIME-INR
INR: 1.08 (ref 0.00–1.49)
Prothrombin Time: 14.2 seconds (ref 11.6–15.2)

## 2015-10-19 LAB — URINE MICROSCOPIC-ADD ON

## 2015-10-19 LAB — ABO/RH: ABO/RH(D): A POS

## 2015-10-19 LAB — APTT: aPTT: 28 seconds (ref 24–37)

## 2015-10-19 MED ORDER — CIPROFLOXACIN HCL 500 MG PO TABS: 500.0000 mg | ORAL_TABLET | Freq: Two times a day (BID) | ORAL | Status: DC

## 2015-10-19 MED ORDER — DEXTROSE 5 % IV SOLN
1.0000 g | Freq: Once | INTRAVENOUS | Status: AC
  Administered 2015-10-19: 1 g via INTRAVENOUS
  Filled 2015-10-19: qty 10

## 2015-10-19 MED ORDER — SODIUM CHLORIDE 0.9 % IV BOLUS (SEPSIS)
1000.0000 mL | Freq: Once | INTRAVENOUS | Status: AC
Start: 2015-10-19 — End: 2015-10-19
  Administered 2015-10-19: 1000 mL via INTRAVENOUS

## 2015-10-19 NOTE — ED Notes (Addendum)
Pt arrived via EMS from [facility]F with report of falling without injury and NIH scale 0. Pt reported tripping and falling but has been lightheadedness/dizziness and hitting head on floor. Pt denies visual disturbances, headaches and nausea.  MAEE and incontinent of urine with foul odor,urinary frequency and lower abd pain with palpation. Spouse reported increase in agitation and hallucinations for past several days with psych medication changes at facility. No deformities of ext, bruising, and swelling noted

## 2015-10-19 NOTE — ED Provider Notes (Addendum)
CSN: [id number]     Arrival date & time 10/19/15  4098 History   First MD Initiated Contact with Patient 10/19/15 0730     Chief Complaint  Patient presents with  . Fall    Level V caveat secondary to dementia (Consider location/radiation/quality/duration/timing/severity/associated sxs/prior Treatment) HPI  82 year old male presents via EMS from assisted living care facility. EMS states that they were called out as fall. The stay on the arrived they were given no further history and transported the patient. Patient reports that he has fallen out of bed the past 2 nights. He complains of pain in his left hip. He is unable to give me further history regarding what occurred. He states that he can't walk now. He does not think that he was walking when he fell but is unclear how he fell out of bed twice.   Past Medical History  Diagnosis Date  . Occlusion and stenosis of carotid artery without mention of cerebral infarction   . Impaired fasting glucose   . Other and unspecified hyperlipidemia   . Obstructive sleep apnea (adult) (pediatric)   . Chronic kidney disease, stage II (mild)   . Osteoarthrosis, unspecified whether generalized or localized, lower leg   . Allergic rhinitis, cause unspecified   . Esophageal reflux   . Unspecified vitamin D deficiency   . Anxiety state, unspecified   . Depressive disorder, not elsewhere classified   . 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 Memory loss 08/28/2013   Past Surgical History  Procedure Laterality Date  . Carotid endarterectomy Right   . Inguinal hernia repair  2011  . Turp vaporization     Family History  Problem Relation Age of Onset  . Family history unknown: Yes   Social History  Substance Use Topics  . Smoking status: Never Smoker   . Smokeless tobacco: Never Used  . Alcohol Use: 0.0 oz/week    0 Standard drinks or equivalent per week     Comment: occas wine    Review of Systems  Unable to perform ROS: Dementia       Allergies  Gadobenate dimeglumine and Seasonal ic  Home Medications   Prior to Admission medications   Medication Sig Start Date End Date Taking? Authorizing Provider  Acetaminophen (TYLENOL EXTRA STRENGTH PO) Take 500 mg by mouth 3 (three) times daily as needed.     Historical Provider, MD  aspirin 81 MG tablet Take 81 mg by mouth at bedtime.     Historical Provider, MD  cetirizine (ZYRTEC) 10 MG tablet Take 10 mg by mouth daily.    Historical Provider, MD  divalproex (DEPAKOTE) 250 MG DR tablet Take 375 mg by mouth 2 (two) times daily.  08/06/15   Historical Provider, MD  docusate sodium (COLACE) 100 MG capsule Take 100 mg by mouth at bedtime.    Historical Provider, MD  fluticasone (FLONASE) 50 MCG/ACT nasal spray Place 2 sprays into both nostrils daily.    Historical Provider, MD  Glycerin, Laxative, (ADULT SUPPOSITORY RE) Place 1 applicator rectally daily as needed (constipation).    Historical Provider, MD  lisinopril (PRINIVIL,ZESTRIL) 2.5 MG tablet Take 2.5 mg by mouth daily.  06/04/15   Historical Provider, MD  memantine (NAMENDA) 10 MG tablet Take 1 tablet (10 mg total) by mouth 2 (two) times daily. 04/19/14   Huston Foley, MD  Multiple Vitamin (MULTIVITAMIN WITH MINERALS) TABS tablet Take 1 tablet by mouth daily.    Historical Provider, MD  ondansetron (ZOFRAN) 4 MG tablet Take  4 mg by mouth every 8 (eight) hours as needed for nausea or vomiting.    Historical Provider, MD  pravastatin (PRAVACHOL) 10 MG tablet Take 10 mg by mouth daily.  04/16/15   Historical Provider, MD  traMADol (ULTRAM) 50 MG tablet Take 1 tablet (50 mg total) by mouth 2 (two) times daily as needed for moderate pain or severe pain (pain). 09/20/14   Kathlen Mody, MD  venlafaxine XR (EFFEXOR-XR) 37.5 MG 24 hr capsule Take 37.5 mg by mouth daily. 09/13/15   Historical Provider, MD   BP 132/54 mmHg  Pulse 70  Temp(Src) 98.1 F (36.7 C) (Oral)  Resp 18  Ht  (1.905 m)  Wt 90.719 kg  BMI 25.00 kg/m2   SpO2 94% Physical Exam  Constitutional: He is oriented to person, place, and time. He appears well-developed and well-nourished. No distress.  HENT:  Head: Normocephalic.  Right Ear: External ear normal.  Left Ear: External ear normal.  Mouth/Throat: Oropharynx is clear and moist.  Eyes: Conjunctivae and EOM are normal. Pupils are equal, round, and reactive to light.  Neck: Normal range of motion. Neck supple.  Cardiovascular: Normal rate, regular rhythm, normal heart sounds and intact distal pulses.   Pulmonary/Chest: Effort normal and breath sounds normal.  Abdominal: Bowel sounds are normal.  Musculoskeletal: He exhibits tenderness.       Legs: Neurological: He is alert and oriented to person, place, and time.  Skin: Skin is warm and dry.  Psychiatric: He has a normal mood and affect. His behavior is normal. Judgment and thought content normal.  Nursing note and vitals reviewed.   ED Course  Procedures (including critical care time) Labs Review Labs Reviewed  URINE CULTURE  APTT  COMPREHENSIVE METABOLIC PANEL  CBC WITH DIFFERENTIAL/PLATELET  PROTIME-INR  TYPE AND SCREEN    Imaging Review Dg Chest 1 View  10/19/2015  CLINICAL DATA:  Recent fall EXAM: CHEST  1 VIEW COMPARISON:  09/24/1959 FINDINGS: Cardiac shadow is stable. Mild aortic calcifications are seen. The lungs are well aerated bilaterally. No focal infiltrate or sizable effusion is seen. Mild interstitial changes are again noted. IMPRESSION: No acute abnormality seen. Electronically Signed   By: Alcide Clever M.D.   On: 10/19/2015 09:40   Dg Hip Unilat With Pelvis 2-3 Views Left  10/19/2015  CLINICAL DATA:  Recent fall with hip pain, initial encounter EXAM: DG HIP (WITH OR WITHOUT PELVIS) 2-3V LEFT COMPARISON:  None. FINDINGS: Pelvic ring is intact. Mild degenerative changes of the hip joints are noted. No acute fracture or dislocation is seen. No soft tissue abnormality is noted. IMPRESSION: No acute abnormality seen.  Electronically Signed   By: Alcide Clever M.D.   On: 10/19/2015 09:37   I have personally reviewed and evaluated these images and lab results as part of my medical decision-making.   EKG Interpretation None      MDM   Final diagnoses:  UTI (lower urinary tract infection)  Dementia, with behavioral disturbance  Fall, initial encounter   This is an 82 year old man with frontal lobe dementia from assisted care facility who presents today with reports that he has had increased weakness and fallen out of bed twice. My initial history is obtained from patient and from EMS. His wife and son arrived. His wife states that this is consistent with a prior urinary tract infection. Although my exam elicited some pain over the left hip, she states that he complains of pain with any palpation and that is baseline for  him. Obtained of the left hip reveals no evidence of fracture. Labs are significant for urinary tract infection. He is treated here with 1 g of Rocephin. He is at baseline with ability to bear weight and transfer. He is placed on Cipro. I discussed follow-up and return precautions with the patient's wife and she voices understanding.   Margarita Grizzle, MD 10/19/15 1051  Margarita Grizzle, MD 10/20/15 [phone]

## 2015-10-19 NOTE — Discharge Instructions (Signed)

## 2015-10-19 NOTE — ED Notes (Signed)
Pt tolerated ABT without adverse effects noted.

## 2015-10-19 NOTE — ED Notes (Addendum)
Awake. Verbally responsive. A/O x2 (self/place). Resp even and unlabored. No audible adventitious breath sounds noted. ABC's intact. IV infusing NS at 944ml/hr without difficulty. Family at bedside.

## 2015-10-19 NOTE — ED Notes (Signed)
Blood drawn via lab. Pt given water for po challenge without n/v reported.

## 2015-10-19 NOTE — ED Notes (Signed)
Pt ambulated very short distance to wc with assist x1. Pt able to bear weight.

## 2015-10-19 NOTE — ED Notes (Signed)
Awake. Verbally responsive. A/O x4. Resp even and unlabored. No audible adventitious breath sounds noted. ABC's intact.  

## 2015-10-19 NOTE — ED Notes (Signed)
Bed: WA09 Expected date:  Expected time:  Means of arrival:  Comments: EMS 

## 2015-10-19 NOTE — ED Notes (Signed)
Awake. Verbally responsive. A/O x2. Resp even and unlabored. No audible adventitious breath sounds noted. ABC's intact. IV infusing NS at 950ml/hr without difficulty.

## 2015-10-19 NOTE — ED Notes (Signed)
LAB CALLED, PT STUCK 2 X

## 2015-10-21 DIAGNOSIS — Z9181 History of falling: Secondary | ICD-10-CM | POA: Diagnosis not present

## 2015-10-21 DIAGNOSIS — R2681 Unsteadiness on feet: Secondary | ICD-10-CM | POA: Diagnosis not present

## 2015-10-21 DIAGNOSIS — R29898 Other symptoms and signs involving the musculoskeletal system: Secondary | ICD-10-CM | POA: Diagnosis not present

## 2015-10-21 DIAGNOSIS — R296 Repeated falls: Secondary | ICD-10-CM | POA: Diagnosis not present

## 2015-10-21 DIAGNOSIS — R262 Difficulty in walking, not elsewhere classified: Secondary | ICD-10-CM | POA: Diagnosis not present

## 2015-10-21 LAB — URINE CULTURE

## 2015-10-22 ENCOUNTER — Telehealth (HOSPITAL_COMMUNITY): Payer: Self-pay

## 2015-10-22 DIAGNOSIS — Z9181 History of falling: Secondary | ICD-10-CM | POA: Diagnosis not present

## 2015-10-22 DIAGNOSIS — R319 Hematuria, unspecified: Secondary | ICD-10-CM | POA: Diagnosis not present

## 2015-10-22 DIAGNOSIS — R296 Repeated falls: Secondary | ICD-10-CM | POA: Diagnosis not present

## 2015-10-22 DIAGNOSIS — R29898 Other symptoms and signs involving the musculoskeletal system: Secondary | ICD-10-CM | POA: Diagnosis not present

## 2015-10-22 DIAGNOSIS — N183 Chronic kidney disease, stage 3 (moderate): Secondary | ICD-10-CM | POA: Diagnosis not present

## 2015-10-22 DIAGNOSIS — R2681 Unsteadiness on feet: Secondary | ICD-10-CM | POA: Diagnosis not present

## 2015-10-22 DIAGNOSIS — N200 Calculus of kidney: Secondary | ICD-10-CM | POA: Diagnosis not present

## 2015-10-22 DIAGNOSIS — I10 Essential (primary) hypertension: Secondary | ICD-10-CM | POA: Diagnosis not present

## 2015-10-22 DIAGNOSIS — R0600 Dyspnea, unspecified: Secondary | ICD-10-CM | POA: Diagnosis not present

## 2015-10-22 DIAGNOSIS — R262 Difficulty in walking, not elsewhere classified: Secondary | ICD-10-CM | POA: Diagnosis not present

## 2015-10-22 NOTE — Telephone Encounter (Signed)
Post ED Visit - Positive Culture Follow-up  Culture report reviewed by antimicrobial stewardship pharmacist:   Enzo Bi, Pharm.D.  Celedonio Miyamoto, Pharm.D., BCPS  Garvin Fila, Pharm.D.  Georgina Pillion, Pharm.D., BCPS  [place], [address].D., BCPS, AAHIVP  Estella Husk, Pharm.D., BCPS, AAHIVP  Tennis Must, Pharm.D.  Curley Spice, Pharm.D.  Positive urine culture Treated with cipro, organism sensitive to the same and no further patient follow-up is required at this time.  Ashley Jacobs 10/22/2015, 2:12 PM

## 2015-10-23 DIAGNOSIS — R2681 Unsteadiness on feet: Secondary | ICD-10-CM | POA: Diagnosis not present

## 2015-10-23 DIAGNOSIS — Z9181 History of falling: Secondary | ICD-10-CM | POA: Diagnosis not present

## 2015-10-23 DIAGNOSIS — R296 Repeated falls: Secondary | ICD-10-CM | POA: Diagnosis not present

## 2015-10-23 DIAGNOSIS — R262 Difficulty in walking, not elsewhere classified: Secondary | ICD-10-CM | POA: Diagnosis not present

## 2015-10-23 DIAGNOSIS — R29898 Other symptoms and signs involving the musculoskeletal system: Secondary | ICD-10-CM | POA: Diagnosis not present

## 2015-10-24 DIAGNOSIS — R2681 Unsteadiness on feet: Secondary | ICD-10-CM | POA: Diagnosis not present

## 2015-10-24 DIAGNOSIS — R262 Difficulty in walking, not elsewhere classified: Secondary | ICD-10-CM | POA: Diagnosis not present

## 2015-10-24 DIAGNOSIS — R296 Repeated falls: Secondary | ICD-10-CM | POA: Diagnosis not present

## 2015-10-24 DIAGNOSIS — R29898 Other symptoms and signs involving the musculoskeletal system: Secondary | ICD-10-CM | POA: Diagnosis not present

## 2015-10-24 DIAGNOSIS — Z9181 History of falling: Secondary | ICD-10-CM | POA: Diagnosis not present

## 2015-10-28 DIAGNOSIS — R2681 Unsteadiness on feet: Secondary | ICD-10-CM | POA: Diagnosis not present

## 2015-10-28 DIAGNOSIS — R296 Repeated falls: Secondary | ICD-10-CM | POA: Diagnosis not present

## 2015-10-28 DIAGNOSIS — R262 Difficulty in walking, not elsewhere classified: Secondary | ICD-10-CM | POA: Diagnosis not present

## 2015-10-28 DIAGNOSIS — G309 Alzheimer's disease, unspecified: Secondary | ICD-10-CM | POA: Diagnosis not present

## 2015-10-28 DIAGNOSIS — R29898 Other symptoms and signs involving the musculoskeletal system: Secondary | ICD-10-CM | POA: Diagnosis not present

## 2015-10-28 DIAGNOSIS — R269 Unspecified abnormalities of gait and mobility: Secondary | ICD-10-CM | POA: Diagnosis not present

## 2015-10-28 DIAGNOSIS — Z9181 History of falling: Secondary | ICD-10-CM | POA: Diagnosis not present

## 2015-10-29 DIAGNOSIS — Z9181 History of falling: Secondary | ICD-10-CM | POA: Diagnosis not present

## 2015-10-29 DIAGNOSIS — R296 Repeated falls: Secondary | ICD-10-CM | POA: Diagnosis not present

## 2015-10-29 DIAGNOSIS — R2681 Unsteadiness on feet: Secondary | ICD-10-CM | POA: Diagnosis not present

## 2015-10-29 DIAGNOSIS — R262 Difficulty in walking, not elsewhere classified: Secondary | ICD-10-CM | POA: Diagnosis not present

## 2015-10-29 DIAGNOSIS — R29898 Other symptoms and signs involving the musculoskeletal system: Secondary | ICD-10-CM | POA: Diagnosis not present

## 2015-10-30 DIAGNOSIS — R296 Repeated falls: Secondary | ICD-10-CM | POA: Diagnosis not present

## 2015-10-30 DIAGNOSIS — R262 Difficulty in walking, not elsewhere classified: Secondary | ICD-10-CM | POA: Diagnosis not present

## 2015-10-30 DIAGNOSIS — Z9181 History of falling: Secondary | ICD-10-CM | POA: Diagnosis not present

## 2015-10-30 DIAGNOSIS — R2681 Unsteadiness on feet: Secondary | ICD-10-CM | POA: Diagnosis not present

## 2015-10-30 DIAGNOSIS — R29898 Other symptoms and signs involving the musculoskeletal system: Secondary | ICD-10-CM | POA: Diagnosis not present

## 2015-10-31 DIAGNOSIS — R2681 Unsteadiness on feet: Secondary | ICD-10-CM | POA: Diagnosis not present

## 2015-10-31 DIAGNOSIS — R29898 Other symptoms and signs involving the musculoskeletal system: Secondary | ICD-10-CM | POA: Diagnosis not present

## 2015-10-31 DIAGNOSIS — G300 Alzheimer's disease with early onset: Secondary | ICD-10-CM | POA: Diagnosis not present

## 2015-10-31 DIAGNOSIS — R262 Difficulty in walking, not elsewhere classified: Secondary | ICD-10-CM | POA: Diagnosis not present

## 2015-10-31 DIAGNOSIS — I10 Essential (primary) hypertension: Secondary | ICD-10-CM | POA: Diagnosis not present

## 2015-10-31 DIAGNOSIS — Z9181 History of falling: Secondary | ICD-10-CM | POA: Diagnosis not present

## 2015-10-31 DIAGNOSIS — I120 Hypertensive chronic kidney disease with stage 5 chronic kidney disease or end stage renal disease: Secondary | ICD-10-CM | POA: Diagnosis not present

## 2015-10-31 DIAGNOSIS — R269 Unspecified abnormalities of gait and mobility: Secondary | ICD-10-CM | POA: Diagnosis not present

## 2015-10-31 DIAGNOSIS — R296 Repeated falls: Secondary | ICD-10-CM | POA: Diagnosis not present

## 2015-11-01 DIAGNOSIS — R29898 Other symptoms and signs involving the musculoskeletal system: Secondary | ICD-10-CM | POA: Diagnosis not present

## 2015-11-01 DIAGNOSIS — R2681 Unsteadiness on feet: Secondary | ICD-10-CM | POA: Diagnosis not present

## 2015-11-01 DIAGNOSIS — R296 Repeated falls: Secondary | ICD-10-CM | POA: Diagnosis not present

## 2015-11-01 DIAGNOSIS — Z9181 History of falling: Secondary | ICD-10-CM | POA: Diagnosis not present

## 2015-11-01 DIAGNOSIS — R262 Difficulty in walking, not elsewhere classified: Secondary | ICD-10-CM | POA: Diagnosis not present

## 2015-11-04 ENCOUNTER — Encounter: Payer: Self-pay | Admitting: Neurology

## 2015-11-04 ENCOUNTER — Ambulatory Visit (INDEPENDENT_AMBULATORY_CARE_PROVIDER_SITE_OTHER): Payer: Medicare Other | Admitting: Neurology

## 2015-11-04 VITALS — BP 136/60 | HR 70 | Resp 14

## 2015-11-04 DIAGNOSIS — Z9181 History of falling: Secondary | ICD-10-CM

## 2015-11-04 DIAGNOSIS — R262 Difficulty in walking, not elsewhere classified: Secondary | ICD-10-CM | POA: Diagnosis not present

## 2015-11-04 DIAGNOSIS — R2681 Unsteadiness on feet: Secondary | ICD-10-CM | POA: Diagnosis not present

## 2015-11-04 DIAGNOSIS — R296 Repeated falls: Secondary | ICD-10-CM

## 2015-11-04 DIAGNOSIS — F0391 Unspecified dementia with behavioral disturbance: Secondary | ICD-10-CM

## 2015-11-04 DIAGNOSIS — F03918 Unspecified dementia, unspecified severity, with other behavioral disturbance: Secondary | ICD-10-CM

## 2015-11-04 DIAGNOSIS — R269 Unspecified abnormalities of gait and mobility: Secondary | ICD-10-CM

## 2015-11-04 DIAGNOSIS — R29898 Other symptoms and signs involving the musculoskeletal system: Secondary | ICD-10-CM | POA: Diagnosis not present
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
## 2015-11-04 NOTE — Patient Instructions (Signed)
You are at fall risk and need more supervision.  Supportive care is key at this point.  I recommend transition to memory care.  Push oral fluid intake.  Keep medications the same.

## 2015-11-05 DIAGNOSIS — R296 Repeated falls: Secondary | ICD-10-CM | POA: Diagnosis not present

## 2015-11-05 DIAGNOSIS — R262 Difficulty in walking, not elsewhere classified: Secondary | ICD-10-CM | POA: Diagnosis not present

## 2015-11-05 DIAGNOSIS — R2681 Unsteadiness on feet: Secondary | ICD-10-CM | POA: Diagnosis not present

## 2015-11-05 DIAGNOSIS — Z9181 History of falling: Secondary | ICD-10-CM | POA: Diagnosis not present

## 2015-11-05 DIAGNOSIS — R29898 Other symptoms and signs involving the musculoskeletal system: Secondary | ICD-10-CM | POA: Diagnosis not present

## 2015-11-06 DIAGNOSIS — R2681 Unsteadiness on feet: Secondary | ICD-10-CM | POA: Diagnosis not present

## 2015-11-06 DIAGNOSIS — R296 Repeated falls: Secondary | ICD-10-CM | POA: Diagnosis not present

## 2015-11-06 DIAGNOSIS — Z9181 History of falling: Secondary | ICD-10-CM | POA: Diagnosis not present

## 2015-11-06 DIAGNOSIS — R262 Difficulty in walking, not elsewhere classified: Secondary | ICD-10-CM | POA: Diagnosis not present

## 2015-11-06 DIAGNOSIS — R29898 Other symptoms and signs involving the musculoskeletal system: Secondary | ICD-10-CM | POA: Diagnosis not present

## 2015-11-07 DIAGNOSIS — Z9181 History of falling: Secondary | ICD-10-CM | POA: Diagnosis not present

## 2015-11-07 DIAGNOSIS — R2681 Unsteadiness on feet: Secondary | ICD-10-CM | POA: Diagnosis not present

## 2015-11-07 DIAGNOSIS — R296 Repeated falls: Secondary | ICD-10-CM | POA: Diagnosis not present

## 2015-11-07 DIAGNOSIS — R262 Difficulty in walking, not elsewhere classified: Secondary | ICD-10-CM | POA: Diagnosis not present

## 2015-11-07 DIAGNOSIS — R29898 Other symptoms and signs involving the musculoskeletal system: Secondary | ICD-10-CM | POA: Diagnosis not present

## 2015-11-08 DIAGNOSIS — Z9181 History of falling: Secondary | ICD-10-CM | POA: Diagnosis not present

## 2015-11-08 DIAGNOSIS — R262 Difficulty in walking, not elsewhere classified: Secondary | ICD-10-CM | POA: Diagnosis not present

## 2015-11-08 DIAGNOSIS — R29898 Other symptoms and signs involving the musculoskeletal system: Secondary | ICD-10-CM | POA: Diagnosis not present

## 2015-11-08 DIAGNOSIS — R296 Repeated falls: Secondary | ICD-10-CM | POA: Diagnosis not present

## 2015-11-08 DIAGNOSIS — R2681 Unsteadiness on feet: Secondary | ICD-10-CM | POA: Diagnosis not present

## 2015-11-11 DIAGNOSIS — Z9181 History of falling: Secondary | ICD-10-CM | POA: Diagnosis not present

## 2015-11-11 DIAGNOSIS — R2681 Unsteadiness on feet: Secondary | ICD-10-CM | POA: Diagnosis not present

## 2015-11-11 DIAGNOSIS — R29898 Other symptoms and signs involving the musculoskeletal system: Secondary | ICD-10-CM | POA: Diagnosis not present

## 2015-11-11 DIAGNOSIS — R296 Repeated falls: Secondary | ICD-10-CM | POA: Diagnosis not present

## 2015-11-11 DIAGNOSIS — R262 Difficulty in walking, not elsewhere classified: Secondary | ICD-10-CM | POA: Diagnosis not present

## 2015-11-12 DIAGNOSIS — R29898 Other symptoms and signs involving the musculoskeletal system: Secondary | ICD-10-CM | POA: Diagnosis not present

## 2015-11-12 DIAGNOSIS — Z9181 History of falling: Secondary | ICD-10-CM | POA: Diagnosis not present

## 2015-11-12 DIAGNOSIS — R2681 Unsteadiness on feet: Secondary | ICD-10-CM | POA: Diagnosis not present

## 2015-11-12 DIAGNOSIS — R296 Repeated falls: Secondary | ICD-10-CM | POA: Diagnosis not present

## 2015-11-12 DIAGNOSIS — R262 Difficulty in walking, not elsewhere classified: Secondary | ICD-10-CM | POA: Diagnosis not present

## 2015-11-13 DIAGNOSIS — R29898 Other symptoms and signs involving the musculoskeletal system: Secondary | ICD-10-CM | POA: Diagnosis not present

## 2015-11-13 DIAGNOSIS — R2681 Unsteadiness on feet: Secondary | ICD-10-CM | POA: Diagnosis not present

## 2015-11-13 DIAGNOSIS — R262 Difficulty in walking, not elsewhere classified: Secondary | ICD-10-CM | POA: Diagnosis not present

## 2015-11-13 DIAGNOSIS — Z9181 History of falling: Secondary | ICD-10-CM | POA: Diagnosis not present

## 2015-11-13 DIAGNOSIS — R296 Repeated falls: Secondary | ICD-10-CM | POA: Diagnosis not present

## 2015-11-14 DIAGNOSIS — R29898 Other symptoms and signs involving the musculoskeletal system: Secondary | ICD-10-CM | POA: Diagnosis not present

## 2015-11-14 DIAGNOSIS — R2681 Unsteadiness on feet: Secondary | ICD-10-CM | POA: Diagnosis not present

## 2015-11-14 DIAGNOSIS — Z9181 History of falling: Secondary | ICD-10-CM | POA: Diagnosis not present

## 2015-11-14 DIAGNOSIS — R262 Difficulty in walking, not elsewhere classified: Secondary | ICD-10-CM | POA: Diagnosis not present

## 2015-11-14 DIAGNOSIS — R296 Repeated falls: Secondary | ICD-10-CM | POA: Diagnosis not present

## 2015-11-15 DIAGNOSIS — R296 Repeated falls: Secondary | ICD-10-CM | POA: Diagnosis not present

## 2015-11-15 DIAGNOSIS — R262 Difficulty in walking, not elsewhere classified: Secondary | ICD-10-CM | POA: Diagnosis not present

## 2015-11-15 DIAGNOSIS — R29898 Other symptoms and signs involving the musculoskeletal system: Secondary | ICD-10-CM | POA: Diagnosis not present

## 2015-11-15 DIAGNOSIS — R2681 Unsteadiness on feet: Secondary | ICD-10-CM | POA: Diagnosis not present

## 2015-11-15 DIAGNOSIS — Z9181 History of falling: Secondary | ICD-10-CM | POA: Diagnosis not present

## 2015-11-18 DIAGNOSIS — R296 Repeated falls: Secondary | ICD-10-CM | POA: Diagnosis not present

## 2015-11-18 DIAGNOSIS — Z9181 History of falling: Secondary | ICD-10-CM | POA: Diagnosis not present

## 2015-11-18 DIAGNOSIS — R2681 Unsteadiness on feet: Secondary | ICD-10-CM | POA: Diagnosis not present

## 2015-11-18 DIAGNOSIS — R262 Difficulty in walking, not elsewhere classified: Secondary | ICD-10-CM | POA: Diagnosis not present

## 2015-11-18 DIAGNOSIS — R29898 Other symptoms and signs involving the musculoskeletal system: Secondary | ICD-10-CM | POA: Diagnosis not present

## 2015-11-19 DIAGNOSIS — R2681 Unsteadiness on feet: Secondary | ICD-10-CM | POA: Diagnosis not present

## 2015-11-19 DIAGNOSIS — R296 Repeated falls: Secondary | ICD-10-CM | POA: Diagnosis not present

## 2015-11-19 DIAGNOSIS — Z9181 History of falling: Secondary | ICD-10-CM | POA: Diagnosis not present

## 2015-11-19 DIAGNOSIS — R262 Difficulty in walking, not elsewhere classified: Secondary | ICD-10-CM | POA: Diagnosis not present

## 2015-11-19 DIAGNOSIS — R29898 Other symptoms and signs involving the musculoskeletal system: Secondary | ICD-10-CM | POA: Diagnosis not present

## 2015-11-19 DIAGNOSIS — D649 Anemia, unspecified: Secondary | ICD-10-CM | POA: Diagnosis not present

## 2015-11-21 DIAGNOSIS — R296 Repeated falls: Secondary | ICD-10-CM | POA: Diagnosis not present

## 2015-11-21 DIAGNOSIS — R2681 Unsteadiness on feet: Secondary | ICD-10-CM | POA: Diagnosis not present

## 2015-11-21 DIAGNOSIS — R262 Difficulty in walking, not elsewhere classified: Secondary | ICD-10-CM | POA: Diagnosis not present

## 2015-11-21 DIAGNOSIS — R29898 Other symptoms and signs involving the musculoskeletal system: Secondary | ICD-10-CM | POA: Diagnosis not present

## 2015-11-21 DIAGNOSIS — Z9181 History of falling: Secondary | ICD-10-CM | POA: Diagnosis not present

## 2015-11-22 DIAGNOSIS — R29898 Other symptoms and signs involving the musculoskeletal system: Secondary | ICD-10-CM | POA: Diagnosis not present

## 2015-11-22 DIAGNOSIS — R262 Difficulty in walking, not elsewhere classified: Secondary | ICD-10-CM | POA: Diagnosis not present

## 2015-11-22 DIAGNOSIS — R296 Repeated falls: Secondary | ICD-10-CM | POA: Diagnosis not present

## 2015-11-22 DIAGNOSIS — R2681 Unsteadiness on feet: Secondary | ICD-10-CM | POA: Diagnosis not present

## 2015-11-22 DIAGNOSIS — Z9181 History of falling: Secondary | ICD-10-CM | POA: Diagnosis not present

## 2015-11-25 DIAGNOSIS — Z9181 History of falling: Secondary | ICD-10-CM | POA: Diagnosis not present

## 2015-11-25 DIAGNOSIS — R2681 Unsteadiness on feet: Secondary | ICD-10-CM | POA: Diagnosis not present

## 2015-11-25 DIAGNOSIS — R296 Repeated falls: Secondary | ICD-10-CM | POA: Diagnosis not present

## 2015-11-25 DIAGNOSIS — R29898 Other symptoms and signs involving the musculoskeletal system: Secondary | ICD-10-CM | POA: Diagnosis not present

## 2015-11-25 DIAGNOSIS — R262 Difficulty in walking, not elsewhere classified: Secondary | ICD-10-CM | POA: Diagnosis not present

## 2015-11-26 DIAGNOSIS — R29898 Other symptoms and signs involving the musculoskeletal system: Secondary | ICD-10-CM | POA: Diagnosis not present

## 2015-11-26 DIAGNOSIS — R296 Repeated falls: Secondary | ICD-10-CM | POA: Diagnosis not present

## 2015-11-26 DIAGNOSIS — G309 Alzheimer's disease, unspecified: Secondary | ICD-10-CM | POA: Diagnosis not present

## 2015-11-26 DIAGNOSIS — Z9181 History of falling: Secondary | ICD-10-CM | POA: Diagnosis not present

## 2015-11-26 DIAGNOSIS — R2681 Unsteadiness on feet: Secondary | ICD-10-CM | POA: Diagnosis not present

## 2015-11-26 DIAGNOSIS — R269 Unspecified abnormalities of gait and mobility: Secondary | ICD-10-CM | POA: Diagnosis not present

## 2015-11-26 DIAGNOSIS — R262 Difficulty in walking, not elsewhere classified: Secondary | ICD-10-CM | POA: Diagnosis not present

## 2015-11-27 DIAGNOSIS — R262 Difficulty in walking, not elsewhere classified: Secondary | ICD-10-CM | POA: Diagnosis not present

## 2015-11-27 DIAGNOSIS — R29898 Other symptoms and signs involving the musculoskeletal system: Secondary | ICD-10-CM | POA: Diagnosis not present

## 2015-11-27 DIAGNOSIS — Z9181 History of falling: Secondary | ICD-10-CM | POA: Diagnosis not present

## 2015-11-27 DIAGNOSIS — R2681 Unsteadiness on feet: Secondary | ICD-10-CM | POA: Diagnosis not present

## 2015-11-27 DIAGNOSIS — R296 Repeated falls: Secondary | ICD-10-CM | POA: Diagnosis not present

## 2015-11-28 DIAGNOSIS — R262 Difficulty in walking, not elsewhere classified: Secondary | ICD-10-CM | POA: Diagnosis not present

## 2015-11-28 DIAGNOSIS — R269 Unspecified abnormalities of gait and mobility: Secondary | ICD-10-CM | POA: Diagnosis not present

## 2015-11-28 DIAGNOSIS — R29898 Other symptoms and signs involving the musculoskeletal system: Secondary | ICD-10-CM | POA: Diagnosis not present

## 2015-11-28 DIAGNOSIS — R296 Repeated falls: Secondary | ICD-10-CM | POA: Diagnosis not present

## 2015-11-28 DIAGNOSIS — R2681 Unsteadiness on feet: Secondary | ICD-10-CM | POA: Diagnosis not present

## 2015-11-28 DIAGNOSIS — Z9181 History of falling: Secondary | ICD-10-CM | POA: Diagnosis not present

## 2015-11-29 DIAGNOSIS — R296 Repeated falls: Secondary | ICD-10-CM | POA: Diagnosis not present

## 2015-11-29 DIAGNOSIS — Z9181 History of falling: Secondary | ICD-10-CM | POA: Diagnosis not present

## 2015-11-29 DIAGNOSIS — R2681 Unsteadiness on feet: Secondary | ICD-10-CM | POA: Diagnosis not present

## 2015-11-29 DIAGNOSIS — R29898 Other symptoms and signs involving the musculoskeletal system: Secondary | ICD-10-CM | POA: Diagnosis not present

## 2015-11-29 DIAGNOSIS — R262 Difficulty in walking, not elsewhere classified: Secondary | ICD-10-CM | POA: Diagnosis not present

## 2015-12-03 DIAGNOSIS — R296 Repeated falls: Secondary | ICD-10-CM | POA: Diagnosis not present

## 2015-12-03 DIAGNOSIS — Z9181 History of falling: Secondary | ICD-10-CM | POA: Diagnosis not present

## 2015-12-03 DIAGNOSIS — R2681 Unsteadiness on feet: Secondary | ICD-10-CM | POA: Diagnosis not present

## 2015-12-03 DIAGNOSIS — R29898 Other symptoms and signs involving the musculoskeletal system: Secondary | ICD-10-CM | POA: Diagnosis not present

## 2015-12-03 DIAGNOSIS — R262 Difficulty in walking, not elsewhere classified: Secondary | ICD-10-CM | POA: Diagnosis not present

## 2015-12-04 DIAGNOSIS — R262 Difficulty in walking, not elsewhere classified: Secondary | ICD-10-CM | POA: Diagnosis not present

## 2015-12-04 DIAGNOSIS — R2681 Unsteadiness on feet: Secondary | ICD-10-CM | POA: Diagnosis not present

## 2015-12-04 DIAGNOSIS — Z9181 History of falling: Secondary | ICD-10-CM | POA: Diagnosis not present

## 2015-12-04 DIAGNOSIS — R296 Repeated falls: Secondary | ICD-10-CM | POA: Diagnosis not present

## 2015-12-04 DIAGNOSIS — R29898 Other symptoms and signs involving the musculoskeletal system: Secondary | ICD-10-CM | POA: Diagnosis not present

## 2015-12-05 DIAGNOSIS — R29898 Other symptoms and signs involving the musculoskeletal system: Secondary | ICD-10-CM | POA: Diagnosis not present

## 2015-12-05 DIAGNOSIS — R262 Difficulty in walking, not elsewhere classified: Secondary | ICD-10-CM | POA: Diagnosis not present

## 2015-12-05 DIAGNOSIS — R296 Repeated falls: Secondary | ICD-10-CM | POA: Diagnosis not present

## 2015-12-05 DIAGNOSIS — Z9181 History of falling: Secondary | ICD-10-CM | POA: Diagnosis not present

## 2015-12-05 DIAGNOSIS — R2681 Unsteadiness on feet: Secondary | ICD-10-CM | POA: Diagnosis not present

## 2015-12-09 DIAGNOSIS — R2681 Unsteadiness on feet: Secondary | ICD-10-CM | POA: Diagnosis not present

## 2015-12-09 DIAGNOSIS — R262 Difficulty in walking, not elsewhere classified: Secondary | ICD-10-CM | POA: Diagnosis not present

## 2015-12-09 DIAGNOSIS — Z9181 History of falling: Secondary | ICD-10-CM | POA: Diagnosis not present

## 2015-12-09 DIAGNOSIS — R29898 Other symptoms and signs involving the musculoskeletal system: Secondary | ICD-10-CM | POA: Diagnosis not present

## 2015-12-09 DIAGNOSIS — R296 Repeated falls: Secondary | ICD-10-CM | POA: Diagnosis not present

## 2015-12-10 ENCOUNTER — Ambulatory Visit: Payer: Self-pay | Admitting: Neurology

## 2015-12-10 DIAGNOSIS — B351 Tinea unguium: Secondary | ICD-10-CM | POA: Diagnosis not present

## 2015-12-10 DIAGNOSIS — R296 Repeated falls: Secondary | ICD-10-CM | POA: Diagnosis not present

## 2015-12-10 DIAGNOSIS — R262 Difficulty in walking, not elsewhere classified: Secondary | ICD-10-CM | POA: Diagnosis not present

## 2015-12-10 DIAGNOSIS — L84 Corns and callosities: Secondary | ICD-10-CM | POA: Diagnosis not present

## 2015-12-10 DIAGNOSIS — R29898 Other symptoms and signs involving the musculoskeletal system: Secondary | ICD-10-CM | POA: Diagnosis not present

## 2015-12-10 DIAGNOSIS — Z9181 History of falling: Secondary | ICD-10-CM | POA: Diagnosis not present

## 2015-12-10 DIAGNOSIS — R2681 Unsteadiness on feet: Secondary | ICD-10-CM | POA: Diagnosis not present

## 2015-12-11 DIAGNOSIS — R29898 Other symptoms and signs involving the musculoskeletal system: Secondary | ICD-10-CM | POA: Diagnosis not present

## 2015-12-11 DIAGNOSIS — R262 Difficulty in walking, not elsewhere classified: Secondary | ICD-10-CM | POA: Diagnosis not present

## 2015-12-11 DIAGNOSIS — Z9181 History of falling: Secondary | ICD-10-CM | POA: Diagnosis not present

## 2015-12-11 DIAGNOSIS — R296 Repeated falls: Secondary | ICD-10-CM | POA: Diagnosis not present

## 2015-12-11 DIAGNOSIS — R2681 Unsteadiness on feet: Secondary | ICD-10-CM | POA: Diagnosis not present

## 2015-12-12 DIAGNOSIS — Z9181 History of falling: Secondary | ICD-10-CM | POA: Diagnosis not present

## 2015-12-12 DIAGNOSIS — R262 Difficulty in walking, not elsewhere classified: Secondary | ICD-10-CM | POA: Diagnosis not present

## 2015-12-12 DIAGNOSIS — R2681 Unsteadiness on feet: Secondary | ICD-10-CM | POA: Diagnosis not present

## 2015-12-12 DIAGNOSIS — R296 Repeated falls: Secondary | ICD-10-CM | POA: Diagnosis not present

## 2015-12-12 DIAGNOSIS — R29898 Other symptoms and signs involving the musculoskeletal system: Secondary | ICD-10-CM | POA: Diagnosis not present

## 2015-12-13 DIAGNOSIS — Z9181 History of falling: Secondary | ICD-10-CM | POA: Diagnosis not present

## 2015-12-13 DIAGNOSIS — R296 Repeated falls: Secondary | ICD-10-CM | POA: Diagnosis not present

## 2015-12-13 DIAGNOSIS — R262 Difficulty in walking, not elsewhere classified: Secondary | ICD-10-CM | POA: Diagnosis not present

## 2015-12-13 DIAGNOSIS — R29898 Other symptoms and signs involving the musculoskeletal system: Secondary | ICD-10-CM | POA: Diagnosis not present

## 2015-12-13 DIAGNOSIS — R2681 Unsteadiness on feet: Secondary | ICD-10-CM | POA: Diagnosis not present

## 2015-12-20 DIAGNOSIS — R319 Hematuria, unspecified: Secondary | ICD-10-CM | POA: Diagnosis not present

## 2015-12-24 DIAGNOSIS — M79672 Pain in left foot: Secondary | ICD-10-CM | POA: Diagnosis not present

## 2015-12-24 DIAGNOSIS — I10 Essential (primary) hypertension: Secondary | ICD-10-CM | POA: Diagnosis not present

## 2015-12-24 DIAGNOSIS — N200 Calculus of kidney: Secondary | ICD-10-CM | POA: Diagnosis not present

## 2015-12-24 DIAGNOSIS — R319 Hematuria, unspecified: Secondary | ICD-10-CM | POA: Diagnosis not present

## 2015-12-24 DIAGNOSIS — Z0000 Encounter for general adult medical examination without abnormal findings: Secondary | ICD-10-CM | POA: Diagnosis not present

## 2015-12-24 DIAGNOSIS — K5901 Slow transit constipation: Secondary | ICD-10-CM | POA: Diagnosis not present

## 2015-12-24 DIAGNOSIS — N390 Urinary tract infection, site not specified: Secondary | ICD-10-CM | POA: Diagnosis not present

## 2015-12-25 DIAGNOSIS — M25579 Pain in unspecified ankle and joints of unspecified foot: Secondary | ICD-10-CM | POA: Diagnosis not present

## 2015-12-26 DIAGNOSIS — R296 Repeated falls: Secondary | ICD-10-CM | POA: Diagnosis not present

## 2015-12-26 DIAGNOSIS — R269 Unspecified abnormalities of gait and mobility: Secondary | ICD-10-CM | POA: Diagnosis not present

## 2015-12-26 DIAGNOSIS — G309 Alzheimer's disease, unspecified: Secondary | ICD-10-CM | POA: Diagnosis not present

## 2015-12-29 DIAGNOSIS — R269 Unspecified abnormalities of gait and mobility: Secondary | ICD-10-CM | POA: Diagnosis not present

## 2015-12-29 DIAGNOSIS — Z9181 History of falling: Secondary | ICD-10-CM | POA: Diagnosis not present

## 2015-12-31 DIAGNOSIS — R627 Adult failure to thrive: Secondary | ICD-10-CM | POA: Diagnosis not present

## 2015-12-31 DIAGNOSIS — M1000 Idiopathic gout, unspecified site: Secondary | ICD-10-CM | POA: Diagnosis not present

## 2016-01-07 DIAGNOSIS — G309 Alzheimer's disease, unspecified: Secondary | ICD-10-CM | POA: Diagnosis not present

## 2016-01-07 DIAGNOSIS — M1990 Unspecified osteoarthritis, unspecified site: Secondary | ICD-10-CM | POA: Diagnosis not present

## 2016-01-07 DIAGNOSIS — I10 Essential (primary) hypertension: Secondary | ICD-10-CM | POA: Diagnosis not present

## 2016-01-14 DIAGNOSIS — E785 Hyperlipidemia, unspecified: Secondary | ICD-10-CM | POA: Diagnosis not present

## 2016-01-14 DIAGNOSIS — N200 Calculus of kidney: Secondary | ICD-10-CM | POA: Diagnosis not present

## 2016-01-14 DIAGNOSIS — M109 Gout, unspecified: Secondary | ICD-10-CM | POA: Diagnosis not present

## 2016-01-20 ENCOUNTER — Encounter (HOSPITAL_COMMUNITY): Payer: Self-pay | Admitting: Emergency Medicine

## 2016-01-20 ENCOUNTER — Emergency Department (HOSPITAL_COMMUNITY)
Admission: EM | Admit: 2016-01-20 | Discharge: 2016-01-20 | Disposition: A | Discharge status: Home / Self Care | Payer: Medicare Other | Attending: Emergency Medicine | Admitting: Emergency Medicine

## 2016-01-20 ENCOUNTER — Emergency Department (HOSPITAL_COMMUNITY): Payer: Medicare Other

## 2016-01-20 DIAGNOSIS — R296 Repeated falls: Secondary | ICD-10-CM | POA: Diagnosis not present

## 2016-01-20 DIAGNOSIS — Z7951 Long term (current) use of inhaled steroids: Secondary | ICD-10-CM | POA: Diagnosis not present

## 2016-01-20 DIAGNOSIS — G4733 Obstructive sleep apnea (adult) (pediatric): Secondary | ICD-10-CM | POA: Diagnosis not present

## 2016-01-20 DIAGNOSIS — Z79899 Other long term (current) drug therapy: Secondary | ICD-10-CM | POA: Insufficient documentation

## 2016-01-20 DIAGNOSIS — Z792 Long term (current) use of antibiotics: Secondary | ICD-10-CM | POA: Diagnosis not present

## 2016-01-20 DIAGNOSIS — T148 Other injury of unspecified body region: Secondary | ICD-10-CM | POA: Diagnosis not present

## 2016-01-20 DIAGNOSIS — Z7982 Long term (current) use of aspirin: Secondary | ICD-10-CM | POA: Insufficient documentation

## 2016-01-20 DIAGNOSIS — N390 Urinary tract infection, site not specified: Secondary | ICD-10-CM | POA: Diagnosis not present

## 2016-01-20 DIAGNOSIS — R930 Abnormal findings on diagnostic imaging of skull and head, not elsewhere classified: Secondary | ICD-10-CM | POA: Diagnosis not present

## 2016-01-20 DIAGNOSIS — R319 Hematuria, unspecified: Secondary | ICD-10-CM | POA: Diagnosis not present

## 2016-01-20 DIAGNOSIS — S3993XA Unspecified injury of pelvis, initial encounter: Secondary | ICD-10-CM | POA: Diagnosis not present

## 2016-01-20 DIAGNOSIS — I6529 Occlusion and stenosis of unspecified carotid artery: Secondary | ICD-10-CM | POA: Insufficient documentation

## 2016-01-20 DIAGNOSIS — E785 Hyperlipidemia, unspecified: Secondary | ICD-10-CM | POA: Insufficient documentation

## 2016-01-20 DIAGNOSIS — M549 Dorsalgia, unspecified: Secondary | ICD-10-CM | POA: Diagnosis not present

## 2016-01-20 DIAGNOSIS — N182 Chronic kidney disease, stage 2 (mild): Secondary | ICD-10-CM | POA: Diagnosis not present

## 2016-01-20 DIAGNOSIS — S3992XA Unspecified injury of lower back, initial encounter: Secondary | ICD-10-CM | POA: Diagnosis not present

## 2016-01-20 DIAGNOSIS — F329 Major depressive disorder, single episode, unspecified: Secondary | ICD-10-CM | POA: Diagnosis not present

## 2016-01-20 DIAGNOSIS — M179 Osteoarthritis of knee, unspecified: Secondary | ICD-10-CM | POA: Diagnosis not present

## 2016-01-20 DIAGNOSIS — S299XXA Unspecified injury of thorax, initial encounter: Secondary | ICD-10-CM | POA: Diagnosis not present

## 2016-01-20 DIAGNOSIS — K219 Gastro-esophageal reflux disease without esophagitis: Secondary | ICD-10-CM | POA: Diagnosis not present

## 2016-01-20 DIAGNOSIS — M545 Low back pain: Secondary | ICD-10-CM | POA: Diagnosis present

## 2016-01-20 LAB — COMPREHENSIVE METABOLIC PANEL
ALBUMIN: 3.5 g/dL (ref 3.5–5.0)
ALK PHOS: 71 U/L (ref 38–126)
ALT: 30 U/L (ref 17–63)
AST: 27 U/L (ref 15–41)
Anion gap: 7 (ref 5–15)
BILIRUBIN TOTAL: 0.5 mg/dL (ref 0.3–1.2)
BUN: 26 mg/dL — AB (ref 6–20)
CALCIUM: 9.2 mg/dL (ref 8.9–10.3)
CO2: 28 mmol/L (ref 22–32)
Chloride: 110 mmol/L (ref 101–111)
Creatinine, Ser: 0.99 mg/dL (ref 0.61–1.24)
GFR calc Af Amer: >60 mL/min (ref >60)
GFR calc non Af Amer: >60 mL/min (ref >60)
GLUCOSE: 98 mg/dL (ref 65–99)
POTASSIUM: 3.9 mmol/L (ref 3.5–5.1)
SODIUM: 145 mmol/L (ref 135–145)
TOTAL PROTEIN: 7.4 g/dL (ref 6.5–8.1)

## 2016-01-20 LAB — CBC WITH DIFFERENTIAL/PLATELET
Basophils Absolute: 0 10*3/uL (ref 0.0–0.1)
Basophils Relative: 1 %
EOS ABS: 0.2 10*3/uL (ref 0.0–0.7)
Eosinophils Relative: 3 %
HEMATOCRIT: 40 % (ref 39.0–52.0)
HEMOGLOBIN: 13.7 g/dL (ref 13.0–17.0)
LYMPHS ABS: 1.3 10*3/uL (ref 0.7–4.0)
Lymphocytes Relative: 15 %
MCH: 30.2 pg (ref 26.0–34.0)
MCHC: 34.3 g/dL (ref 30.0–36.0)
MCV: 88.3 fL (ref 78.0–100.0)
MONO ABS: 1 10*3/uL (ref 0.1–1.0)
MONOS PCT: 11 %
NEUTROS PCT: 70 %
Neutro Abs: 6.3 10*3/uL (ref 1.7–7.7)
Platelets: 201 10*3/uL (ref 150–400)
RBC: 4.53 MIL/uL (ref 4.22–5.81)
RDW: 15.2 % (ref 11.5–15.5)
WBC: 8.9 10*3/uL (ref 4.0–10.5)

## 2016-01-20 LAB — URINALYSIS, ROUTINE W REFLEX MICROSCOPIC
Bilirubin Urine: NEGATIVE
GLUCOSE, UA: NEGATIVE mg/dL
Ketones, ur: NEGATIVE mg/dL
Nitrite: POSITIVE — AB
PH: 6 (ref 5.0–8.0)
PROTEIN: 30 mg/dL — AB
SPECIFIC GRAVITY, URINE: 1.017 (ref 1.005–1.030)

## 2016-01-20 LAB — URINE MICROSCOPIC-ADD ON

## 2016-01-20 LAB — I-STAT CG4 LACTIC ACID, ED: Lactic Acid, Venous: 1.45 mmol/L (ref 0.5–2.0)

## 2016-01-20 MED ORDER — CEPHALEXIN 500 MG PO CAPS: 500.0000 mg | ORAL_CAPSULE | Freq: Three times a day (TID) | ORAL | Status: DC

## 2016-01-20 MED ORDER — ACETAMINOPHEN 325 MG PO TABS: 650.0000 mg | ORAL_TABLET | Freq: Four times a day (QID) | ORAL | Status: DC | PRN

## 2016-01-20 MED ORDER — SODIUM CHLORIDE 0.9 % IV BOLUS (SEPSIS): 500.0000 mL | Freq: Once | INTRAVENOUS | Status: DC

## 2016-01-20 NOTE — ED Notes (Signed)
JUST RETURNED FROM RADIOLOGY

## 2016-01-20 NOTE — ED Notes (Signed)
MD at bedside. 

## 2016-01-20 NOTE — Discharge Instructions (Signed)
Fall Prevention in the Home  °Falls can cause injuries and can affect people from all age groups. There are many simple things that you can do to make your home safe and to help prevent falls. °WHAT CAN I DO ON THE OUTSIDE OF MY HOME? °· Regularly repair the edges of walkways and driveways and fix any cracks. °· Remove high doorway thresholds. °· Trim any shrubbery on the main path into your home. °· Use bright outdoor lighting. °· Clear walkways of debris and clutter, including tools and rocks. °· Regularly check that handrails are securely fastened and in good repair. Both sides of any steps should have handrails. °· Install guardrails along the edges of any raised decks or porches. °· Have leaves, snow, and ice cleared regularly. °· Use sand or salt on walkways during winter months. °· In the garage, clean up any spills right away, including grease or oil spills. °WHAT CAN I DO IN THE BATHROOM? °· Use night lights. °· Install grab bars by the toilet and in the tub and shower. Do not use towel bars as grab bars. °· Use non-skid mats or decals on the floor of the tub or shower. °· If you need to sit down while you are in the shower, use a plastic, non-slip stool.. °· Keep the floor dry. Immediately clean up any water that spills on the floor. °· Remove soap buildup in the tub or shower on a regular basis. °· Attach bath mats securely with double-sided non-slip rug tape. °· Remove throw rugs and other tripping hazards from the floor. °WHAT CAN I DO IN THE BEDROOM? °· Use night lights. °· Make sure that a bedside light is easy to reach. °· Do not use oversized bedding that drapes onto the floor. °· Have a firm chair that has side arms to use for getting dressed. °· Remove throw rugs and other tripping hazards from the floor. °WHAT CAN I DO IN THE KITCHEN?  °· Clean up any spills right away. °· Avoid walking on wet floors. °· Place frequently used items in easy-to-reach places. °· If you need to reach for something  above you, use a sturdy step stool that has a grab bar. °· Keep electrical cables out of the way. °· Do not use floor polish or wax that makes floors slippery. If you have to use wax, make sure that it is non-skid floor wax. °· Remove throw rugs and other tripping hazards from the floor. °WHAT CAN I DO IN THE STAIRWAYS? °· Do not leave any items on the stairs. °· Make sure that there are handrails on both sides of the stairs. Fix handrails that are broken or loose. Make sure that handrails are as long as the stairways. °· Check any carpeting to make sure that it is firmly attached to the stairs. Fix any carpet that is loose or worn. °· Avoid having throw rugs at the top or bottom of stairways, or secure the rugs with carpet tape to prevent them from moving. °· Make sure that you have a light switch at the top of the stairs and the bottom of the stairs. If you do not have them, have them installed. °WHAT ARE SOME OTHER FALL PREVENTION TIPS? °· Wear closed-toe shoes that fit well and support your feet. Wear shoes that have rubber soles or low heels. °· When you use a stepladder, make sure that it is completely opened and that the sides are firmly locked. Have someone hold the ladder while you   are using it. Do not climb a closed stepladder. °· Add color or contrast paint or tape to grab bars and handrails in your home. Place contrasting color strips on the first and last steps. °· Use mobility aids as needed, such as canes, walkers, scooters, and crutches. °· Turn on lights if it is dark. Replace any light bulbs that burn out. °· Set up furniture so that there are clear paths. Keep the furniture in the same spot. °· Fix any uneven floor surfaces. °· Choose a carpet design that does not hide the edge of steps of a stairway. °· Be aware of any and all pets. °· Review your medicines with your healthcare provider. Some medicines can cause dizziness or changes in blood pressure, which increase your risk of falling. °Talk  with your health care provider about other ways that you can decrease your risk of falls. This may include working with a physical therapist or trainer to improve your strength, balance, and endurance. °  °This information is not intended to replace advice given to you by your health care provider. Make sure you discuss any questions you have with your health care provider. °  °Document Released: 09/04/2002 Document Revised: 01/29/2015 Document Reviewed: 10/19/2014 °Elsevier Interactive Patient Education ©2016 Elsevier Inc. °Urinary Tract Infection °Urinary tract infections (UTIs) can develop anywhere along your urinary tract. Your urinary tract is your body's drainage system for removing wastes and extra water. Your urinary tract includes two kidneys, two ureters, a bladder, and a urethra. Your kidneys are a pair of bean-shaped organs. Each kidney is about the size of your fist. They are located below your ribs, one on each side of your spine. °CAUSES °Infections are caused by microbes, which are microscopic organisms, including fungi, viruses, and bacteria. These organisms are so small that they can only be seen through a microscope. Bacteria are the microbes that most commonly cause UTIs. °SYMPTOMS  °Symptoms of UTIs may vary by age and gender of the patient and by the location of the infection. Symptoms in young women typically include a frequent and intense urge to urinate and a painful, burning feeling in the bladder or urethra during urination. Older women and men are more likely to be tired, shaky, and weak and have muscle aches and abdominal pain. A fever may mean the infection is in your kidneys. Other symptoms of a kidney infection include pain in your back or sides below the ribs, nausea, and vomiting. °DIAGNOSIS °To diagnose a UTI, your caregiver will ask you about your symptoms. Your caregiver will also ask you to provide a urine sample. The urine sample will be tested for bacteria and white blood  cells. White blood cells are made by your body to help fight infection. °TREATMENT  °Typically, UTIs can be treated with medication. Because most UTIs are caused by a bacterial infection, they usually can be treated with the use of antibiotics. The choice of antibiotic and length of treatment depend on your symptoms and the type of bacteria causing your infection. °HOME CARE INSTRUCTIONS °· If you were prescribed antibiotics, take them exactly as your caregiver instructs you. Finish the medication even if you feel better after you have only taken some of the medication. °· Drink enough water and fluids to keep your urine clear or pale yellow. °· Avoid caffeine, tea, and carbonated beverages. They tend to irritate your bladder. °· Empty your bladder often. Avoid holding urine for long periods of time. °· Empty your bladder before and after   sexual intercourse. °· After a bowel movement, women should cleanse from front to back. Use each tissue only once. °SEEK MEDICAL CARE IF:  °· You have back pain. °· You develop a fever. °· Your symptoms do not begin to resolve within 3 days. °SEEK IMMEDIATE MEDICAL CARE IF:  °· You have severe back pain or lower abdominal pain. °· You develop chills. °· You have nausea or vomiting. °· You have continued burning or discomfort with urination. °MAKE SURE YOU:  °· Understand these instructions. °· Will watch your condition. °· Will get help right away if you are not doing well or get worse. °  °This information is not intended to replace advice given to you by your health care provider. Make sure you discuss any questions you have with your health care provider. °  °Document Released: 06/24/2005 Document Revised: 06/05/2015 Document Reviewed: 10/23/2011 °Elsevier Interactive Patient Education ©2016 Elsevier Inc. ° °

## 2016-01-20 NOTE — ED Notes (Signed)
[facility] CALLED AND SPOKE WITH [facility]. ALL DISCHARGE INSTRUCTIONS GIVEN.

## 2016-01-20 NOTE — ED Provider Notes (Signed)
CSN: [id number]     Arrival date & time 01/20/16  1546 History   First MD Initiated Contact with Patient 01/20/16 1621     Chief Complaint  Patient presents with  . Fall     (Consider location/radiation/quality/duration/timing/severity/associated sxs/prior Treatment) Patient is a 82 y.o. male presenting with fall. The history is provided by the patient.  Fall This is a recurrent problem. The current episode started yesterday (fell in bathroom yesterday and out of bed today). The problem occurs constantly. The problem has not changed since onset.Pertinent negatives include no chest pain, no abdominal pain and no shortness of breath. Associated symptoms comments: Low back pain. Nothing aggravates the symptoms. Nothing relieves the symptoms. He has tried nothing for the symptoms.    Past Medical History  Diagnosis Date  . Occlusion and stenosis of carotid artery without mention of cerebral infarction   . Impaired fasting glucose   . Other and unspecified hyperlipidemia   . Obstructive sleep apnea (adult) (pediatric)   . Chronic kidney disease, stage II (mild)   . Osteoarthrosis, unspecified whether generalized or localized, lower leg   . Allergic rhinitis, cause unspecified   . Esophageal reflux   . Unspecified vitamin D deficiency   . Anxiety state, unspecified   . Depressive disorder, not elsewhere classified   . 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 Memory loss 08/28/2013   Past Surgical History  Procedure Laterality Date  . Carotid endarterectomy Right   . Inguinal hernia repair  2011  . Turp vaporization     Family History  Problem Relation Age of Onset  . Family history unknown: Yes   Social History  Substance Use Topics  . Smoking status: Never Smoker   . Smokeless tobacco: Never Used  . Alcohol Use: 0.0 oz/week    0 Standard drinks or equivalent per week     Comment: occas wine    Review of Systems  Unable to perform ROS: Dementia  Respiratory: Negative for  shortness of breath.   Cardiovascular: Negative for chest pain.  Gastrointestinal: Negative for abdominal pain.  Level 5 exception to history: dementia     Allergies  Gadobenate dimeglumine  Home Medications   Prior to Admission medications   Medication Sig Start Date End Date Taking? Authorizing Provider  allopurinol (ZYLOPRIM) 100 MG tablet Take 100 mg by mouth daily.   Yes Historical Provider, MD  aspirin 81 MG tablet Take 81 mg by mouth at bedtime.    Yes Historical Provider, MD  cetirizine (ZYRTEC) 10 MG tablet Take 10 mg by mouth daily.   Yes Historical Provider, MD  Cranberry (ELLURA PO) Take 1 tablet by mouth 2 (two) times daily.   Yes Historical Provider, MD  divalproex (DEPAKOTE) 250 MG DR tablet Take 250 mg by mouth 2 (two) times daily. Take with 125 mg tablet = 375 BID 08/06/15  Yes Historical Provider, MD  docusate sodium (COLACE) 100 MG capsule Take 100 mg by mouth at bedtime.   Yes Historical Provider, MD  ENSURE (ENSURE) Take 237 mLs by mouth 2 (two) times daily.   Yes Historical Provider, MD  fluticasone (FLONASE) 50 MCG/ACT nasal spray Place 2 sprays into both nostrils daily.   Yes Historical Provider, MD  lisinopril (PRINIVIL,ZESTRIL) 2.5 MG tablet Take 2.5 mg by mouth daily.  06/04/15  Yes Historical Provider, MD  Menthol, Topical Analgesic, (BIOFREEZE) 4 % GEL Apply 1 application topically every 8 (eight) hours as needed (pain). Apply to bilateral knees   Yes Historical Provider,  MD  mirtazapine (REMERON) 15 MG tablet Take 7.5 mg by mouth at bedtime.   Yes Historical Provider, MD  Multiple Vitamins-Minerals (MULTIVITAMIN WITH MINERALS) tablet Take 1 tablet by mouth daily.   Yes Historical Provider, MD  nitrofurantoin (MACRODANTIN) 50 MG capsule Take 50 mg by mouth daily.   Yes Historical Provider, MD  ondansetron (ZOFRAN) 4 MG tablet Take 4 mg by mouth every 8 (eight) hours as needed for nausea or vomiting.   Yes Historical Provider, MD  pravastatin (PRAVACHOL) 10 MG  tablet Take 10 mg by mouth daily.  04/16/15  Yes Historical Provider, MD  psyllium (REGULOID) 0.52 g capsule Take 0.52 g by mouth daily.   Yes Historical Provider, MD  QUEtiapine (SEROQUEL) 25 MG tablet Take 25 mg by mouth 2 (two) times daily. Take 1/2 tab in AM and take 1 Tab at Bedtime.   Yes Historical Provider, MD  saccharomyces boulardii (FLORASTOR) 250 MG capsule Take 250 mg by mouth 2 (two) times daily.   Yes Historical Provider, MD  traMADol (ULTRAM) 50 MG tablet Take 50 mg by mouth every 12 (twelve) hours as needed for moderate pain.   Yes Historical Provider, MD   BP 129/45 mmHg  Pulse 82  Temp(Src) 98.6 F (37 C) (Oral)  Resp 16  SpO2 96% Physical Exam  Constitutional: He appears well-developed and well-nourished. No distress.  HENT:  Head: Normocephalic and atraumatic.  Eyes: Conjunctivae are normal.  Neck: Neck supple. No tracheal deviation present.  Cardiovascular: Normal rate and regular rhythm.   Pulmonary/Chest: Effort normal. No respiratory distress.  Abdominal: Soft. He exhibits no distension.  Musculoskeletal:  Mild lower paraspinal muscle tenderness without midline tenderness  Neurological: He is alert. He has normal strength. He is disoriented. No cranial nerve deficit. GCS eye subscore is 4. GCS verbal subscore is 4. GCS motor subscore is 6.  Skin: Skin is warm and dry.  Psychiatric: He has a normal mood and affect.    ED Course  Procedures (including critical care time) Labs Review Labs Reviewed  COMPREHENSIVE METABOLIC PANEL - Abnormal; Notable for the following:    BUN 26 (*)    All other components within normal limits  URINALYSIS, ROUTINE W REFLEX MICROSCOPIC (NOT AT [facility]) - Abnormal; Notable for the following:    APPearance TURBID (*)    Hgb urine dipstick MODERATE (*)    Protein, ur 30 (*)    Nitrite POSITIVE (*)    Leukocytes, UA LARGE (*)    All other components within normal limits  URINE MICROSCOPIC-ADD ON - Abnormal; Notable for the  following:    Squamous Epithelial / LPF 0-5 (*)    Bacteria, UA FEW (*)    All other components within normal limits  URINE CULTURE  CBC WITH DIFFERENTIAL/PLATELET  I-STAT CG4 LACTIC ACID, ED    Imaging Review Dg Chest 2 View  01/20/2016  CLINICAL DATA:  Fall from the hospital bed today. EXAM: CHEST  2 VIEW COMPARISON:  10/19/2015 and 09/24/2015. FINDINGS: The heart size and mediastinal contours are stable. There is aortic atherosclerosis. The lungs are clear. There is no pleural effusion or pneumothorax. No acute osseous findings are seen. IMPRESSION: Stable chest.  No acute cardiopulmonary process. Electronically Signed   By: Carey Bullocks M.D.   On: 01/20/2016 17:50   Dg Lumbar Spine 2-3 Views  01/20/2016  CLINICAL DATA:  Fall from hospital bed today. EXAM: LUMBAR SPINE - 2-3 VIEW COMPARISON:  Abdominal CT 09/24/2015. Lumbar spine radiographs 08/17/2005. More recent radiographs  available. FINDINGS: Five lumbar type vertebral bodies. The alignment is normal. There is multilevel spondylosis with disc space loss, posterior osteophytes and facet hypertrophy. No evidence of acute fracture or pars defect. Postsurgical changes are present within the pelvis related to prior hernia repair. IMPRESSION: No acute lumbar spine findings.  Moderate spondylosis. Electronically Signed   By: Carey BullocksWilliam  Veazey M.D.   On: 01/20/2016 17:52   Dg Pelvis 1-2 Views  01/20/2016  CLINICAL DATA:  Larey SeatFell out of bed EXAM: PELVIS - 1-2 VIEW COMPARISON:  None. FINDINGS: No acute fracture. No dislocation. Unremarkable soft tissues. Advanced degenerative change in the lower lumbar spine. Moderate degenerative change of the hip joints. Osteopenia. IMPRESSION: No acute bony pathology. Electronically Signed   By: Jolaine ClickArthur  Hoss M.D.   On: 01/20/2016 17:50   Ct Head Wo Contrast  01/20/2016  ADDENDUM REPORT: 01/20/2016 18:02 ADDENDUM: Prior right mastoidectomy.  Postop changes similar to prior exam. Electronically Signed   By: Lacy DuverneySteven   Olson M.D.   On: 01/20/2016 18:02  01/20/2016  CLINICAL DATA:  82 year old male post fall yesterday. Dementia. Initial encounter. EXAM: CT HEAD WITHOUT CONTRAST TECHNIQUE: Contiguous axial images were obtained from the base of the skull through the vertex without intravenous contrast. COMPARISON:  09/19/2014 brain MR and 09/18/2014 head CT. FINDINGS: No skull fracture or intracranial hemorrhage. 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 Moderate chronic microvascular changes. Moderate global atrophy. Ventricular prominence probably related to central atrophy rather than hydrocephalus although has progressed slightly since prior exam. No intracranial mass lesion noted on this unenhanced exam. Vascular calcifications. IMPRESSION: No skull fracture or intracranial hemorrhage. Remote infarcts and small vessel disease changes as detailed above. Global atrophy. Electronically Signed: By: Lacy DuverneySteven  Olson M.D. On: 01/20/2016 17:34   I have personally reviewed and evaluated these images and lab results as part of my medical decision-making.   EKG Interpretation None      MDM   Final diagnoses:  Urinary tract infection with hematuria, site unspecified  Recurrent falls    82 y.o. male presents with falls yesterday and today. Complaining of only low back pain. Screening radiology is negative for acute bony injury or bleeding. Screening UA is c/w urinary tract infection, Pt not septic appearing without leukocytosis or vital sign abnormalities. This possibly contributed to falls and back pain. He is on macrobid currently which would have poor renal penetration at this age so will cover empirically with keflex pending culture. Plan to follow up with PCP as needed and return precautions discussed for worsening or new concerning symptoms.     Lyndal Pulleyaniel Reanne Nellums, MD 01/21/16 [phone]

## 2016-01-20 NOTE — ED Notes (Signed)
Bed: WA25 Expected date:  Expected time:  Means of arrival:  Comments: EMS  

## 2016-01-20 NOTE — ED Notes (Signed)
[facility] is [facility] and fell twice unwitnessed yesterday. Lower back pain and moaning since then. Also has dark urine. Dementia at baseline. Alert.

## 2016-01-20 NOTE — ED Notes (Signed)
Patient transported to CT 

## 2016-01-20 NOTE — Progress Notes (Signed)
CSW attempted to meet with patient at bedside. However, he was not present. Nurse and wife state patient was in x-ray.  CSW spoke with wife. Wife confirms that [facility] is [facility], and [facility]. Wife states " I think he fell out of his bed". Wife informed CSW that it is not unusual for patient to fall. Wife states that patient can walk slowly with a walker, but says that he is supposed to be using a wheel chair.  Wife informed CSW that patient has been a resident at facility for 16 months. She states while at facility, the patient receives assistance completing ADL's.   Wife states that if PT is needed she would prefer that they come to facility. Wife states patient has also been receiving PT currently at facility.   Trish MageBrittney Esequiel Kleinfelter, LCSWA [phone] ED CSW 01/20/2016 7:14 PM
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
## 2016-01-21 DIAGNOSIS — N200 Calculus of kidney: Secondary | ICD-10-CM | POA: Diagnosis not present

## 2016-01-21 DIAGNOSIS — R296 Repeated falls: Secondary | ICD-10-CM | POA: Diagnosis not present

## 2016-01-22 LAB — URINE CULTURE

## 2016-01-26 DIAGNOSIS — R296 Repeated falls: Secondary | ICD-10-CM | POA: Diagnosis not present

## 2016-01-26 DIAGNOSIS — R269 Unspecified abnormalities of gait and mobility: Secondary | ICD-10-CM | POA: Diagnosis not present

## 2016-01-26 DIAGNOSIS — G309 Alzheimer's disease, unspecified: Secondary | ICD-10-CM | POA: Diagnosis not present

## 2016-01-28 DIAGNOSIS — Z9181 History of falling: Secondary | ICD-10-CM | POA: Diagnosis not present

## 2016-01-28 DIAGNOSIS — R269 Unspecified abnormalities of gait and mobility: Secondary | ICD-10-CM | POA: Diagnosis not present

## 2016-02-05 DIAGNOSIS — N3020 Other chronic cystitis without hematuria: Secondary | ICD-10-CM | POA: Diagnosis not present

## 2016-02-05 DIAGNOSIS — M1009 Idiopathic gout, multiple sites: Secondary | ICD-10-CM | POA: Diagnosis not present

## 2016-02-05 DIAGNOSIS — R296 Repeated falls: Secondary | ICD-10-CM | POA: Diagnosis not present

## 2016-02-05 DIAGNOSIS — E782 Mixed hyperlipidemia: Secondary | ICD-10-CM | POA: Diagnosis not present

## 2016-02-05 DIAGNOSIS — I10 Essential (primary) hypertension: Secondary | ICD-10-CM | POA: Diagnosis not present

## 2016-02-07 ENCOUNTER — Encounter (HOSPITAL_COMMUNITY): Payer: Self-pay | Admitting: Emergency Medicine

## 2016-02-07 ENCOUNTER — Emergency Department (HOSPITAL_COMMUNITY)
Admission: EM | Admit: 2016-02-07 | Discharge: 2016-02-07 | Disposition: A | Discharge status: Home / Self Care | Payer: Medicare Other | Attending: Emergency Medicine | Admitting: Emergency Medicine

## 2016-02-07 ENCOUNTER — Emergency Department (HOSPITAL_COMMUNITY): Payer: Medicare Other

## 2016-02-07 DIAGNOSIS — Z7951 Long term (current) use of inhaled steroids: Secondary | ICD-10-CM | POA: Diagnosis not present

## 2016-02-07 DIAGNOSIS — Z791 Long term (current) use of non-steroidal anti-inflammatories (NSAID): Secondary | ICD-10-CM | POA: Diagnosis not present

## 2016-02-07 DIAGNOSIS — M25551 Pain in right hip: Secondary | ICD-10-CM | POA: Diagnosis not present

## 2016-02-07 DIAGNOSIS — F419 Anxiety disorder, unspecified: Secondary | ICD-10-CM | POA: Diagnosis not present

## 2016-02-07 DIAGNOSIS — S199XXA Unspecified injury of neck, initial encounter: Secondary | ICD-10-CM | POA: Diagnosis not present

## 2016-02-07 DIAGNOSIS — S79911A Unspecified injury of right hip, initial encounter: Secondary | ICD-10-CM | POA: Insufficient documentation

## 2016-02-07 DIAGNOSIS — Y92129 Unspecified place in nursing home as the place of occurrence of the external cause: Secondary | ICD-10-CM | POA: Insufficient documentation

## 2016-02-07 DIAGNOSIS — Z8679 Personal history of other diseases of the circulatory system: Secondary | ICD-10-CM | POA: Insufficient documentation

## 2016-02-07 DIAGNOSIS — S59901A Unspecified injury of right elbow, initial encounter: Secondary | ICD-10-CM | POA: Diagnosis not present

## 2016-02-07 DIAGNOSIS — F329 Major depressive disorder, single episode, unspecified: Secondary | ICD-10-CM | POA: Diagnosis not present

## 2016-02-07 DIAGNOSIS — Z7982 Long term (current) use of aspirin: Secondary | ICD-10-CM | POA: Insufficient documentation

## 2016-02-07 DIAGNOSIS — E785 Hyperlipidemia, unspecified: Secondary | ICD-10-CM | POA: Insufficient documentation

## 2016-02-07 DIAGNOSIS — F0390 Unspecified dementia without behavioral disturbance: Secondary | ICD-10-CM | POA: Diagnosis not present

## 2016-02-07 DIAGNOSIS — N182 Chronic kidney disease, stage 2 (mild): Secondary | ICD-10-CM | POA: Diagnosis not present

## 2016-02-07 DIAGNOSIS — Z8669 Personal history of other diseases of the nervous system and sense organs: Secondary | ICD-10-CM | POA: Insufficient documentation

## 2016-02-07 DIAGNOSIS — Z79899 Other long term (current) drug therapy: Secondary | ICD-10-CM | POA: Insufficient documentation

## 2016-02-07 DIAGNOSIS — M542 Cervicalgia: Secondary | ICD-10-CM | POA: Diagnosis not present

## 2016-02-07 DIAGNOSIS — W1839XA Other fall on same level, initial encounter: Secondary | ICD-10-CM | POA: Insufficient documentation

## 2016-02-07 DIAGNOSIS — Z8719 Personal history of other diseases of the digestive system: Secondary | ICD-10-CM | POA: Diagnosis not present

## 2016-02-07 DIAGNOSIS — Y9389 Activity, other specified: Secondary | ICD-10-CM | POA: Insufficient documentation

## 2016-02-07 DIAGNOSIS — Z8739 Personal history of other diseases of the musculoskeletal system and connective tissue: Secondary | ICD-10-CM | POA: Insufficient documentation

## 2016-02-07 DIAGNOSIS — W19XXXA Unspecified fall, initial encounter: Secondary | ICD-10-CM

## 2016-02-07 DIAGNOSIS — T148 Other injury of unspecified body region: Secondary | ICD-10-CM | POA: Diagnosis not present

## 2016-02-07 DIAGNOSIS — Y998 Other external cause status: Secondary | ICD-10-CM | POA: Insufficient documentation

## 2016-02-07 LAB — URINE MICROSCOPIC-ADD ON

## 2016-02-07 LAB — URINALYSIS, ROUTINE W REFLEX MICROSCOPIC
BILIRUBIN URINE: NEGATIVE
Glucose, UA: NEGATIVE mg/dL
HGB URINE DIPSTICK: NEGATIVE
Ketones, ur: NEGATIVE mg/dL
Nitrite: NEGATIVE
PH: 7 (ref 5.0–8.0)
PROTEIN: NEGATIVE mg/dL
SPECIFIC GRAVITY, URINE: 1.02 (ref 1.005–1.030)

## 2016-02-07 NOTE — Discharge Instructions (Signed)
You have been evaluated for your recent fall.  Fortunately no evidence of any broken bones.  Please take your home pain medication as needed.  Followup with your doctor for further care.    RICE for Routine Care of Injuries Theroutine careofmanyinjuriesincludes rest, ice, compression, and elevation (RICE therapy). RICE therapy is often recommended for injuries to soft tissues, such as a muscle strain, ligament injuries, bruises, and overuse injuries. It can also be used for some bony injuries. Using RICE therapy can help to relieve pain, lessen swelling, and enable your body to heal. Rest Rest is required to allow your body to heal. This usually involves reducing your normal activities and avoiding use of the injured part of your body. Generally, you can return to your normal activities when you are comfortable and have been given permission by your health care provider. Ice Icing your injury helps to keep the swelling down, and it lessens pain. Do not apply ice directly to your skin.  Put ice in a plastic bag.  Place a towel between your skin and the bag.  Leave the ice on for 20 minutes, 2-3 times a day. Do this for as long as you are directed by your health care provider. Compression Compression means putting pressure on the injured area. Compression helps to keep swelling down, gives support, and helps with discomfort. Compression may be done with an elastic bandage. If an elastic bandage has been applied, follow these general tips:  Remove and reapply the bandage every 3-4 hours or as directed by your health care provider.  Make sure the bandage is not wrapped too tightly, because this can cut off circulation. If part of your body beyond the bandage becomes blue, numb, cold, swollen, or more painful, your bandage is most likely too tight. If this occurs, remove your bandage and reapply it more loosely.  See your health care provider if the bandage seems to be making your problems  worse rather than better. Elevation Elevation means keeping the injured area raised. This helps to lessen swelling and decrease pain. If possible, your injured area should be elevated at or above the level of your heart or the center of your chest. WHEN SHOULD I SEEK MEDICAL CARE? You should seek medical care if:  Your pain and swelling continue.  Your symptoms are getting worse rather than improving. These symptoms may indicate that further evaluation or further X-rays are needed. Sometimes, X-rays may not show a small broken bone (fracture) until a number of days later. Make a follow-up appointment with your health care provider. WHEN SHOULD I SEEK IMMEDIATE MEDICAL CARE? You should seek immediate medical care if:  You have sudden severe pain at or below the area of your injury.  You have redness or increased swelling around your injury.  You have tingling or numbness at or below the area of your injury that does not improve after you remove the elastic bandage.   This information is not intended to replace advice given to you by your health care provider. Make sure you discuss any questions you have with your health care provider.   Document Released: 12/27/2000 Document Revised: 06/05/2015 Document Reviewed: 08/22/2014 Elsevier Interactive Patient Education [employer] Elsevier Inc.

## 2016-02-07 NOTE — ED Notes (Signed)
Per EMS [facility] for unwitnessed fall. Patient was initially c/o neck pain when lying in the floor.  No apparent injuries noted.

## 2016-02-07 NOTE — ED Notes (Signed)
Patient transported to X-ray 

## 2016-02-07 NOTE — ED Provider Notes (Signed)
CSN: [id number]     Arrival date & time 02/07/16  1527 History   First MD Initiated Contact with Patient 02/07/16 1542     Chief Complaint  Patient presents with  . Fall     (Consider location/radiation/quality/duration/timing/severity/associated sxs/prior Treatment) HPI   82 year old male with history of dementia, multifactorial gait disorder, obstructive sleep apnea and osteoarthrosis brought here via EMS from [facility] for evaluation of an unwitnessed fall. Patient reports this afternoon he was trying to get back to his bed when he fell down the ground. He cannot tell me why he fell but states that he was able to call for help. He denies hitting his head or loss of consciousness. He is complaining of pain to the right side of his neck and to his right hip. Patient states he did have some trouble walking due to right hip pain prior to the fall. He mentioned he has difficulty walking. He does not think he has any broken bones. He denies having headache, chest pain, abdominal pain, pain to his extremities, dysuria, focal numbness or weakness. When asked if he would like some pain medication patient declined. Patient states his pain is minimal at this time.  Past Medical History  Diagnosis Date  . Occlusion and stenosis of carotid artery without mention of cerebral infarction   . Impaired fasting glucose   . Other and unspecified hyperlipidemia   . Obstructive sleep apnea (adult) (pediatric)   . Chronic kidney disease, stage II (mild)   . Osteoarthrosis, unspecified whether generalized or localized, lower leg   . Allergic rhinitis, cause unspecified   . Esophageal reflux   . Unspecified vitamin D deficiency   . Anxiety state, unspecified   . Depressive disorder, not elsewhere classified   . 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 Memory loss 08/28/2013   Past Surgical History  Procedure Laterality Date  . Carotid endarterectomy Right   . Inguinal hernia repair  2011  .  Turp vaporization     Family History  Problem Relation Age of Onset  . Family history unknown: Yes   Social History  Substance Use Topics  . Smoking status: Never Smoker   . Smokeless tobacco: Never Used  . Alcohol Use: 0.0 oz/week    0 Standard drinks or equivalent per week     Comment: occas wine    Review of Systems  Unable to perform ROS: Dementia      Allergies  Gadobenate dimeglumine  Home Medications   Prior to Admission medications   Medication Sig Start Date End Date Taking? Authorizing Provider  acetaminophen (TYLENOL) 325 MG tablet Take 2 tablets (650 mg total) by mouth every 6 (six) hours as needed for mild pain. Patient taking differently: Take 650 mg by mouth 2 (two) times daily.  01/20/16  Yes Lyndal Pulley, MD  allopurinol (ZYLOPRIM) 100 MG tablet Take 100 mg by mouth daily.   Yes Historical Provider, MD  aspirin 81 MG tablet Take 81 mg by mouth at bedtime.    Yes Historical Provider, MD  cetirizine (ZYRTEC) 10 MG tablet Take 10 mg by mouth daily.   Yes Historical Provider, MD  divalproex (DEPAKOTE ER) 250 MG 24 hr tablet Take 250 mg by mouth at bedtime.   Yes Historical Provider, MD  docusate sodium (COLACE) 100 MG capsule Take 100 mg by mouth at bedtime.   Yes Historical Provider, MD  ENSURE (ENSURE) Take 237 mLs by mouth 2 (two) times daily. 1000 AND 2000.  Yes Historical Provider, MD  fluticasone (FLONASE) 50 MCG/ACT nasal spray Place 2 sprays into both nostrils daily.   Yes Historical Provider, MD  lisinopril (PRINIVIL,ZESTRIL) 2.5 MG tablet Take 2.5 mg by mouth daily.  06/04/15  Yes Historical Provider, MD  meloxicam (MOBIC) 7.5 MG tablet Take 7.5 mg by mouth daily.   Yes Historical Provider, MD  Menthol, Topical Analgesic, (BIOFREEZE) 4 % GEL Apply 1 application topically every 8 (eight) hours as needed (pain.). Apply to bilateral knees. Do not exceed 3 applications within 24 hours.   Yes Historical Provider, MD  mirtazapine (REMERON) 15 MG tablet Take 7.5  mg by mouth at bedtime.   Yes Historical Provider, MD  Multiple Vitamins-Minerals (MULTIVITAMIN WITH MINERALS) tablet Take 1 tablet by mouth daily.   Yes Historical Provider, MD  nitrofurantoin (MACRODANTIN) 50 MG capsule Take 50 mg by mouth daily.   Yes Historical Provider, MD  ondansetron (ZOFRAN) 4 MG tablet Take 4 mg by mouth every 8 (eight) hours as needed for nausea or vomiting.   Yes Historical Provider, MD  pravastatin (PRAVACHOL) 10 MG tablet Take 10 mg by mouth daily.  04/16/15  Yes Historical Provider, MD  psyllium (REGULOID) 0.52 g capsule Take 0.52 g by mouth daily.   Yes Historical Provider, MD  QUEtiapine (SEROQUEL) 25 MG tablet Take 12.5-25 mg by mouth See admin instructions. Take 1/2 tab (12.5 mg)  in AM and take 1 Tab (25 mg)  at Bedtime.   Yes Historical Provider, MD  saccharomyces boulardii (FLORASTOR) 250 MG capsule Take 250 mg by mouth 2 (two) times daily.   Yes Historical Provider, MD  traMADol (ULTRAM) 50 MG tablet Take 50 mg by mouth every 12 (twelve) hours as needed for moderate pain.   Yes Historical Provider, MD  cephALEXin (KEFLEX) 500 MG capsule Take 1 capsule (500 mg total) by mouth 3 (three) times daily. Patient not taking: Reported on 02/07/2016 01/20/16   Lyndal Pulleyaniel Knott, MD   There were no vitals taken for this visit. Physical Exam  Constitutional: He appears well-developed and well-nourished. No distress.  Elderly Caucasian male laying in bed in no acute discomfort.  HENT:  Head: Atraumatic.  No scalp tenderness or scalp hematoma. No hemotympanum, septal hematoma, malocclusion, or midface tenderness  Eyes: Conjunctivae and EOM are normal. Pupils are equal, round, and reactive to light.  Neck: Neck supple.  Tenderness to cervical spine and right paraspinal muscle on palpation without crepitus or step-off. Neck is supple.  Cardiovascular: Normal rate and regular rhythm.   Pulmonary/Chest: Effort normal and breath sounds normal.  Abdominal: Soft. There is no  tenderness.  Musculoskeletal: He exhibits tenderness (Mild tenderness to right lateral hip on palpation with normal hip flexion extension abduction and abduction. No gross deformity noted.).  Mild tenderness to right elbow with normal elbow flexion and extension and no deformity noted.  Neurological: He is alert.  Patient is alert to self and place as well as situation but unable to recall date, month, or year.  Skin: No rash noted.  Psychiatric: He has a normal mood and affect.  Nursing note and vitals reviewed.   ED Course  Procedures (including critical care time) Labs Review Labs Reviewed  URINALYSIS, ROUTINE W REFLEX MICROSCOPIC (NOT AT [facility]) - Abnormal; Notable for the following:    Leukocytes, UA TRACE (*)    All other components within normal limits  URINE MICROSCOPIC-ADD ON - Abnormal; Notable for the following:    Squamous Epithelial / LPF 0-5 (*)  Bacteria, UA RARE (*)    All other components within normal limits    Imaging Review Dg Cervical Spine Complete  02/07/2016  CLINICAL DATA:  Pain following fall EXAM: CERVICAL SPINE - COMPLETE 4+ VIEW COMPARISON:  None. FINDINGS: Frontal, lateral, open-mouth odontoid, and bilateral oblique views were obtained. There is no fracture or spondylolisthesis. Prevertebral soft tissues and predental space regions are normal. There is moderately severe disc space narrowing at C6-7 and C7-T1. There is moderate disc space narrowing at C5-6. There is exit foraminal narrowing due to bony hypertrophy at all levels except for C2-3. There is nuchal ligament calcification posterior to C5. There are foci of carotid artery calcification on the left. IMPRESSION: Multilevel arthropathy, most marked at C6-7 and C7-T1. No fracture or spondylolisthesis. Foci of calcification in the left carotid artery. Electronically Signed   By: Bretta Bang III M.D.   On: 02/07/2016 16:18   Dg Hip Unilat With Pelvis 2-3 Views Right  02/07/2016  CLINICAL DATA:   Patient comes from [employer] unit for unwitnessed fall. Pt c/o pain to right hip. Nonsmoker. EXAM: DG HIP (WITH OR WITHOUT PELVIS) 2-3V RIGHT COMPARISON:  None. FINDINGS: There are mild degenerative changes in both hips and the lumbar spine. No evidence for acute fracture or subluxation. Postoperative changes are identified overlying the lower pelvis. IMPRESSION: No evidence for acute  abnormality. Electronically Signed   By: Norva Pavlov M.D.   On: 02/07/2016 16:59   I have personally reviewed and evaluated these images and lab results as part of my medical decision-making.   EKG Interpretation None      MDM   Final diagnoses:  Fall at nursing home, initial encounter    BP 145/65 mmHg  Pulse 70  Resp 16  SpO2 100%   4:30 PM Patient history of dementia here for evaluation of a recent fall. He does have some pain to his right side of neck and right hip. X-ray ordered. Will check UA. No head injury or loss of consciousness to require head CT scan at this time.  6:22 PM UA without evidence of urinary tract infection. Patient denies having any dysuria. He requests to be discharged. He does not want pain medication at this time. Care discussed with attending.  Fayrene Helper, PA-C 02/07/16 1824  Alvira Monday, MD 02/10/16 [phone]

## 2016-02-07 NOTE — ED Notes (Signed)
Patient's wife called and stated that patient's son will come pick him up from ED and not to send patient via [facility].

## 2016-02-07 NOTE — ED Notes (Signed)
Pts son here to pick up pt.

## 2016-02-12 ENCOUNTER — Telehealth: Payer: Self-pay | Admitting: Neurology

## 2016-02-28 DIAGNOSIS — Z9181 History of falling: Secondary | ICD-10-CM | POA: Diagnosis not present

## 2016-02-28 DIAGNOSIS — R269 Unspecified abnormalities of gait and mobility: Secondary | ICD-10-CM | POA: Diagnosis not present

## 2016-03-02 ENCOUNTER — Ambulatory Visit: Payer: Self-pay | Admitting: Neurology

## 2016-03-04 ENCOUNTER — Telehealth: Payer: Self-pay | Admitting: Nurse Practitioner

## 2016-03-04 ENCOUNTER — Ambulatory Visit (INDEPENDENT_AMBULATORY_CARE_PROVIDER_SITE_OTHER): Admitting: Nurse Practitioner

## 2016-03-04 ENCOUNTER — Encounter: Payer: Self-pay | Admitting: Nurse Practitioner

## 2016-03-04 VITALS — BP 122/59 | HR 62 | Ht 75.0 in | Wt 182.4 lb

## 2016-03-04 DIAGNOSIS — R413 Other amnesia: Secondary | ICD-10-CM | POA: Diagnosis not present

## 2016-03-04 DIAGNOSIS — R2689 Other abnormalities of gait and mobility: Secondary | ICD-10-CM | POA: Diagnosis not present

## 2016-03-04 DIAGNOSIS — F0390 Unspecified dementia without behavioral disturbance: Secondary | ICD-10-CM
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
## 2016-03-04 NOTE — Telephone Encounter (Signed)
I spoke to Allen Vega and relayed that received form for any pt going out to see MD, that did receive and filled out and faxed back with no changes.  She verbalized understanding.

## 2016-03-04 NOTE — Patient Instructions (Signed)
Continue current meds F/U with Dr. Frances FurbishAthar  In 3 months

## 2016-03-04 NOTE — Telephone Encounter (Signed)
[facility] Vega [phone] called said she is not sure what information NP requested. She said something was mentioned about a state form to be faxed over but she doe not know what form she is referring to. Please call

## 2016-03-11 DIAGNOSIS — M1009 Idiopathic gout, multiple sites: Secondary | ICD-10-CM | POA: Diagnosis not present

## 2016-03-11 DIAGNOSIS — K5901 Slow transit constipation: Secondary | ICD-10-CM | POA: Diagnosis not present

## 2016-03-11 DIAGNOSIS — N3020 Other chronic cystitis without hematuria: Secondary | ICD-10-CM | POA: Diagnosis not present

## 2016-03-11 DIAGNOSIS — R296 Repeated falls: Secondary | ICD-10-CM | POA: Diagnosis not present

## 2016-03-11 DIAGNOSIS — E782 Mixed hyperlipidemia: Secondary | ICD-10-CM | POA: Diagnosis not present

## 2016-03-18 DIAGNOSIS — Z79899 Other long term (current) drug therapy: Secondary | ICD-10-CM | POA: Diagnosis not present

## 2016-03-25 DIAGNOSIS — K5901 Slow transit constipation: Secondary | ICD-10-CM | POA: Diagnosis not present

## 2016-03-25 DIAGNOSIS — R296 Repeated falls: Secondary | ICD-10-CM | POA: Diagnosis not present

## 2016-03-25 DIAGNOSIS — I10 Essential (primary) hypertension: Secondary | ICD-10-CM | POA: Diagnosis not present

## 2016-03-25 DIAGNOSIS — N3020 Other chronic cystitis without hematuria: Secondary | ICD-10-CM | POA: Diagnosis not present

## 2016-03-25 DIAGNOSIS — E782 Mixed hyperlipidemia: Secondary | ICD-10-CM | POA: Diagnosis not present

## 2016-03-26 DIAGNOSIS — M1990 Unspecified osteoarthritis, unspecified site: Secondary | ICD-10-CM | POA: Diagnosis not present

## 2016-03-26 DIAGNOSIS — I672 Cerebral atherosclerosis: Secondary | ICD-10-CM | POA: Diagnosis not present

## 2016-03-26 DIAGNOSIS — K5901 Slow transit constipation: Secondary | ICD-10-CM | POA: Diagnosis not present

## 2016-03-26 DIAGNOSIS — M1009 Idiopathic gout, multiple sites: Secondary | ICD-10-CM | POA: Diagnosis not present

## 2016-03-26 DIAGNOSIS — J309 Allergic rhinitis, unspecified: Secondary | ICD-10-CM | POA: Diagnosis not present

## 2016-04-08 ENCOUNTER — Ambulatory Visit: Payer: Self-pay | Admitting: Neurology

## 2016-04-08 DIAGNOSIS — I10 Essential (primary) hypertension: Secondary | ICD-10-CM | POA: Diagnosis not present

## 2016-04-08 DIAGNOSIS — N3020 Other chronic cystitis without hematuria: Secondary | ICD-10-CM | POA: Diagnosis not present

## 2016-04-08 DIAGNOSIS — R296 Repeated falls: Secondary | ICD-10-CM | POA: Diagnosis not present

## 2016-04-08 DIAGNOSIS — E782 Mixed hyperlipidemia: Secondary | ICD-10-CM | POA: Diagnosis not present

## 2016-04-08 DIAGNOSIS — K5901 Slow transit constipation: Secondary | ICD-10-CM | POA: Diagnosis not present

## 2016-04-09 ENCOUNTER — Telehealth: Payer: Self-pay | Admitting: Neurology

## 2016-04-09 NOTE — Telephone Encounter (Signed)
Pt's wife called to "thank you for all you have done for him over the yrs. You have been so kind, sensitive, perceptive, loving. I hate to stop the visits but it is too hard to get him anywhere anymore. Hospice has come in and will review his case after 90 days but he declining so rapidly. He can not talk anymore or stand but he can still feed himself. Haldol has been added to the medication regime due to difficult behavior. It takes 3 people to handle him. He is at [facility] in memory care. Thank you so much for all you have done".

## 2016-04-15 DIAGNOSIS — I10 Essential (primary) hypertension: Secondary | ICD-10-CM | POA: Diagnosis not present

## 2016-04-15 DIAGNOSIS — R296 Repeated falls: Secondary | ICD-10-CM | POA: Diagnosis not present

## 2016-04-17 ENCOUNTER — Encounter (HOSPITAL_COMMUNITY): Payer: Self-pay | Admitting: Emergency Medicine

## 2016-04-17 ENCOUNTER — Emergency Department (HOSPITAL_COMMUNITY)
Admission: EM | Admit: 2016-04-17 | Discharge: 2016-04-17 | Disposition: A | Discharge status: Home / Self Care | Attending: Emergency Medicine | Admitting: Emergency Medicine

## 2016-04-17 DIAGNOSIS — E785 Hyperlipidemia, unspecified: Secondary | ICD-10-CM | POA: Insufficient documentation

## 2016-04-17 DIAGNOSIS — Z79899 Other long term (current) drug therapy: Secondary | ICD-10-CM | POA: Diagnosis not present

## 2016-04-17 DIAGNOSIS — R4182 Altered mental status, unspecified: Secondary | ICD-10-CM | POA: Diagnosis not present

## 2016-04-17 DIAGNOSIS — N182 Chronic kidney disease, stage 2 (mild): Secondary | ICD-10-CM | POA: Insufficient documentation

## 2016-04-17 DIAGNOSIS — F329 Major depressive disorder, single episode, unspecified: Secondary | ICD-10-CM | POA: Insufficient documentation

## 2016-04-17 DIAGNOSIS — R402421 Glasgow coma scale score 9-12, in the field [EMT or ambulance]: Secondary | ICD-10-CM | POA: Diagnosis not present

## 2016-04-17 DIAGNOSIS — R4189 Other symptoms and signs involving cognitive functions and awareness: Secondary | ICD-10-CM | POA: Diagnosis not present

## 2016-04-17 DIAGNOSIS — F070 Personality change due to known physiological condition: Secondary | ICD-10-CM | POA: Diagnosis not present

## 2016-04-17 DIAGNOSIS — R4689 Other symptoms and signs involving appearance and behavior: Secondary | ICD-10-CM

## 2016-04-17 LAB — I-STAT CHEM 8, ED
BUN: 19 mg/dL (ref 6–20)
CHLORIDE: 104 mmol/L (ref 101–111)
Calcium, Ion: 1.13 mmol/L (ref 1.12–1.23)
Creatinine, Ser: 0.8 mg/dL (ref 0.61–1.24)
Glucose, Bld: 105 mg/dL — ABNORMAL HIGH (ref 65–99)
HCT: 45 % (ref 39.0–52.0)
HEMOGLOBIN: 15.3 g/dL (ref 13.0–17.0)
POTASSIUM: 4.2 mmol/L (ref 3.5–5.1)
SODIUM: 142 mmol/L (ref 135–145)
TCO2: 28 mmol/L (ref 0–100)

## 2016-04-17 LAB — URINALYSIS, ROUTINE W REFLEX MICROSCOPIC
Bilirubin Urine: NEGATIVE
Glucose, UA: NEGATIVE mg/dL
Hgb urine dipstick: NEGATIVE
Ketones, ur: NEGATIVE mg/dL
LEUKOCYTES UA: NEGATIVE
NITRITE: NEGATIVE
PROTEIN: NEGATIVE mg/dL
Specific Gravity, Urine: 1.012 (ref 1.005–1.030)
pH: 7.5 (ref 5.0–8.0)

## 2016-04-17 MED ORDER — HALOPERIDOL LACTATE 5 MG/ML IJ SOLN
2.5000 mg | Freq: Once | INTRAMUSCULAR | Status: AC
  Administered 2016-04-17: 2.5 mg via INTRAVENOUS
  Filled 2016-04-17: qty 1

## 2016-04-17 MED ORDER — HALOPERIDOL LACTATE 5 MG/ML IJ SOLN
2.5000 mg | Freq: Once | INTRAMUSCULAR | Status: AC
  Administered 2016-04-17: 2.5 mg via INTRAMUSCULAR
  Filled 2016-04-17: qty 1

## 2016-04-17 NOTE — ED Notes (Addendum)
Pt from [facility] with AMS that began around 1700. Pt is not verbally responsive and will not open his eyes. Per EMS pt appears to be responding to external stimuli. Per facility pt is normally alert to some degree "he wheels himself around the facility in his wheelchair Staff denies recent fall  Pt has a hx of recurrent UTI and stroke   Pt's wife is on her way here. Pt's wife did not want pt to come here. Pt's wife does not want any interventions. Pt's hospice nurse called and expressed this. They will both be here soon

## 2016-04-17 NOTE — ED Provider Notes (Signed)
CSN: [id number]     Arrival date & time 04/17/16  1727 History   First MD Initiated Contact with Patient 04/17/16 1758     Chief Complaint  Patient presents with  . Altered Mental Status     (Consider location/radiation/quality/duration/timing/severity/associated sxs/prior Treatment)  82 y.o. Male with history of memory loss, currently on hospice presents from his nursing facility for change in mental status.  The patient reportedly is usually some what more alert per nursing facility staff.  Staff of facility denies trauma.  Wife at bedside reports that patient has had a gradual decline that has been worsening of late.  She does not want any serious interventions but decided to allow patient to be transported so urine could be checked for possible infection.        Past Medical History:  Diagnosis Date  . Allergic rhinitis, cause unspecified   . Anxiety state, unspecified   . Chronic kidney disease, stage II (mild)   . Depressive disorder, not elsewhere classified   . Esophageal reflux   . Impaired fasting glucose   . Memory loss 08/28/2013  . 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 Obstructive sleep apnea (adult) (pediatric)   . Occlusion and stenosis of carotid artery without mention of cerebral infarction   . Osteoarthrosis, unspecified whether generalized or localized, lower leg   . Other and unspecified hyperlipidemia   . Unspecified vitamin D deficiency    Past Surgical History:  Procedure Laterality Date  . CAROTID ENDARTERECTOMY Right   . INGUINAL HERNIA REPAIR  2011  . TURP VAPORIZATION     Family History  Problem Relation Age of Onset  . Family history unknown: Yes   Social History  Substance Use Topics  . Smoking status: Never Smoker  . Smokeless tobacco: Never Used  . Alcohol use 0.0 oz/week     Comment: occas wine    Review of Systems  Unable to perform ROS: Dementia      Allergies  Gadobenate dimeglumine [gadobenate]  Home Medications    Prior to Admission medications   Medication Sig Start Date End Date Taking? Authorizing Provider  acetaminophen (TYLENOL) 325 MG tablet Take 650 mg by mouth 2 (two) times daily.   Yes Historical Provider, MD  divalproex (DEPAKOTE SPRINKLE) 125 MG capsule Take 375 mg by mouth 2 (two) times daily.   Yes Historical Provider, MD  docusate sodium (COLACE) 100 MG capsule Take 100 mg by mouth 2 (two) times daily.    Yes Historical Provider, MD  ENSURE (ENSURE) Take 237 mLs by mouth 2 (two) times daily. 1000 AND 2000.   Yes Historical Provider, MD  haloperidol (HALDOL) 2 MG tablet Take 2 mg by mouth every 8 (eight) hours.   Yes Historical Provider, MD  meloxicam (MOBIC) 7.5 MG tablet Take 7.5 mg by mouth daily.   Yes Historical Provider, MD  mirtazapine (REMERON) 7.5 MG tablet Take 7.5 mg by mouth at bedtime.   Yes Historical Provider, MD  nitrofurantoin (MACRODANTIN) 50 MG capsule Take 50 mg by mouth daily.   Yes Historical Provider, MD  psyllium (REGULOID) 0.52 g capsule Take 0.52 g by mouth daily.   Yes Historical Provider, MD  saccharomyces boulardii (FLORASTOR) 250 MG capsule Take 250 mg by mouth 2 (two) times daily.   Yes Historical Provider, MD  sertraline (ZOLOFT) 50 MG tablet Take 50 mg by mouth daily.   Yes Historical Provider, MD  Menthol, Topical Analgesic, (BIOFREEZE) 4 % GEL Apply 1 application topically every 8 (eight) hours  as needed (pain.). Apply to bilateral knees. Do not exceed 3 applications within 24 hours.    Historical Provider, MD  traMADol (ULTRAM) 50 MG tablet Take 50 mg by mouth every 12 (twelve) hours as needed for moderate pain.    Historical Provider, MD   BP (!) 137/112   Pulse 110   SpO2 99%  Physical Exam  Constitutional: He appears well-developed and well-nourished. He appears distressed.  HENT:  Head: Normocephalic and atraumatic.  Right Ear: External ear normal.  Left Ear: External ear normal.  Mouth/Throat: Oropharynx is clear and moist. No oropharyngeal  exudate.  Eyes: EOM are normal. Pupils are equal, round, and reactive to light.  Neck: Normal range of motion. Neck supple.  Cardiovascular: Normal rate, regular rhythm and intact distal pulses.   Pulmonary/Chest: Effort normal. No respiratory distress. He has no wheezes. He has no rales.  Abdominal: Soft. He exhibits no distension. There is no tenderness.  Musculoskeletal: Normal range of motion. He exhibits no edema.  Neurological: He is alert.  Patient moaning in the bed, leaning to the rightside  Skin: Skin is warm and dry. He is not diaphoretic.    ED Course  Procedures (including critical care time) Labs Review Labs Reviewed  URINALYSIS, ROUTINE W REFLEX MICROSCOPIC (NOT AT [facility]) - Abnormal; Notable for the following:       Result Value   APPearance CLOUDY (*)    All other components within normal limits  I-STAT CHEM 8, ED - Abnormal; Notable for the following:    Glucose, Bld 105 (*)    All other components within normal limits    Imaging Review No results found. I have personally reviewed and evaluated these images and lab results as part of my medical decision-making.   EKG Interpretation None       MDM  Patient was seen and evaluated at bedside.  Agree with wife's wishes for her husband.  She does not want anything checked that would require invasive intervention and would like the patient to be discharged tonight back to his nursing facility.  We did give haldol for treatment to calm patient and keep him safe. Patient UA and I-stta chem 8 were unremarkable.  Per family request patient discharged back to his facility.  Final diagnoses:  Behavioral change        Leta Baptist, MD 04/24/16 1152

## 2016-04-17 NOTE — Discharge Instructions (Signed)

## 2016-04-17 NOTE — ED Notes (Signed)
Unable to obtain vitals because pt is moving arm

## 2016-05-05 ENCOUNTER — Ambulatory Visit: Payer: Self-pay | Admitting: Neurology

## 2016-05-29 DEATH — deceased

## 2016-06-22 ENCOUNTER — Ambulatory Visit: Payer: Self-pay | Admitting: Neurology

## 2017-01-31 IMAGING — CT CT ABD-PELV W/O CM
2 of 4 series · 15 of 46 positions shown, 17 images · non-contrast
Comparison: CT of the abdomen and pelvis from 09/13/2008

CLINICAL DATA: Acute onset of generalized abdominal pain and fever.
Red blood cells in the urine. Initial encounter.

EXAM:
CT ABDOMEN AND PELVIS WITHOUT CONTRAST
TECHNIQUE: Multidetector CT imaging of the abdomen and pelvis was performed
following the standard protocol without IV contrast.

[Series 2: abd/pel w/o · axial · non-contrast · 0.75mm/px · z∈[+1154,+1574]mm · 12 of 100 slices shown, 14 images]
[im 8/100  soft-tissue]
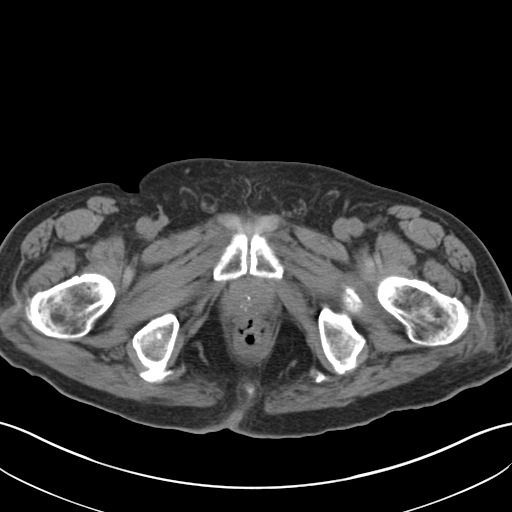
[im 8/100  bone]
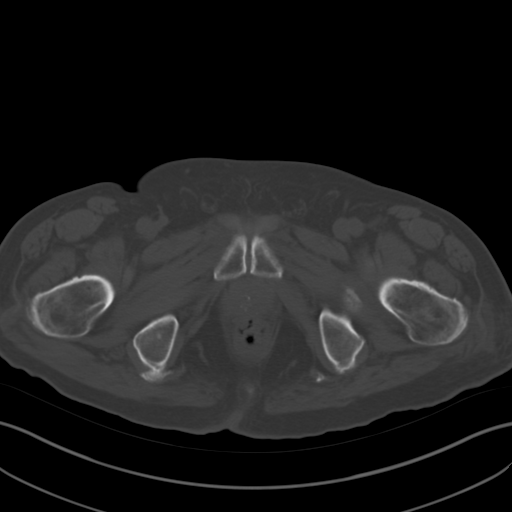
[im 15/100  soft-tissue]
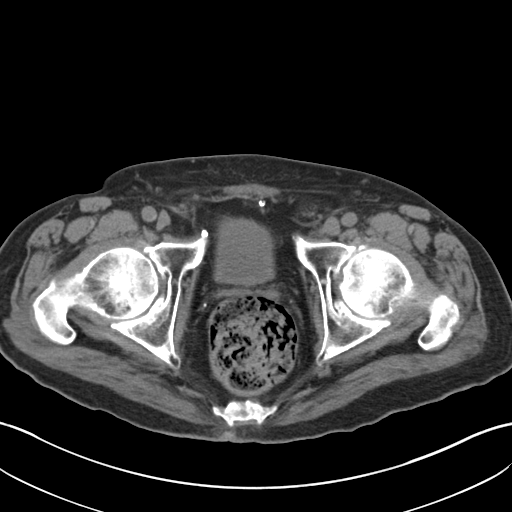
[im 23/100  soft-tissue]
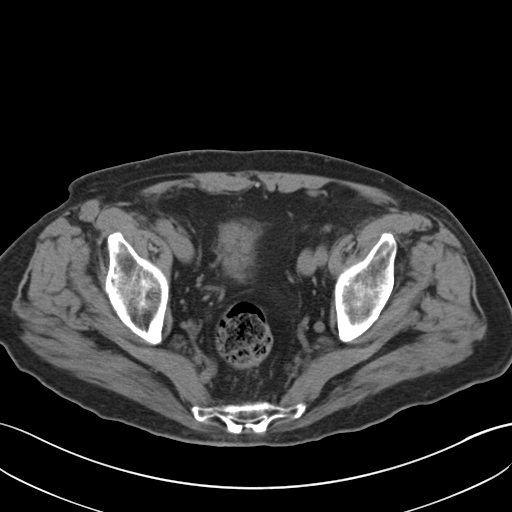
[im 30/100  soft-tissue]
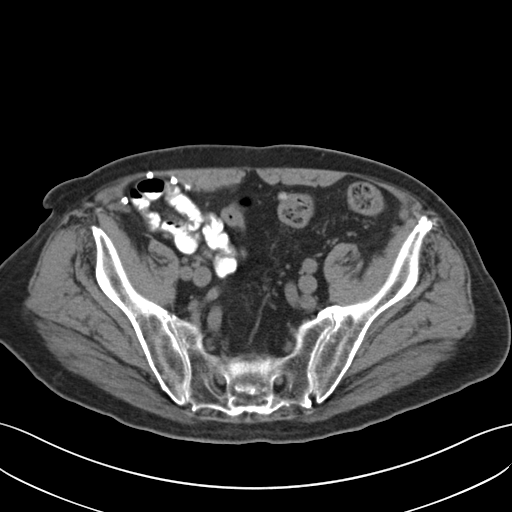
[im 37/100  soft-tissue]
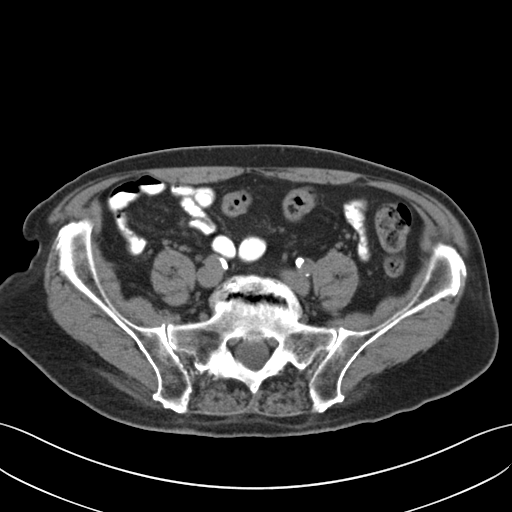
[im 45/100  soft-tissue]
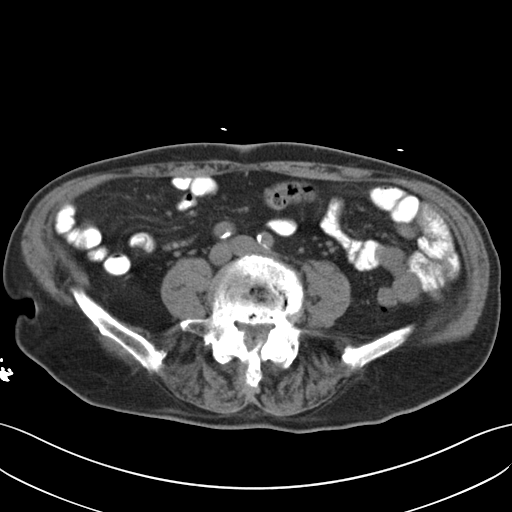
[im 56/100  soft-tissue]
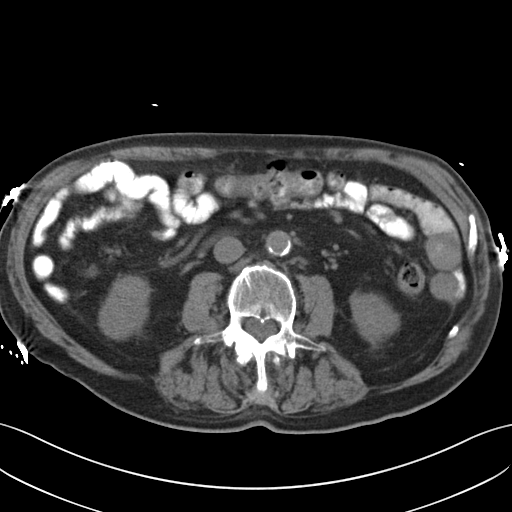
[im 63/100  soft-tissue]
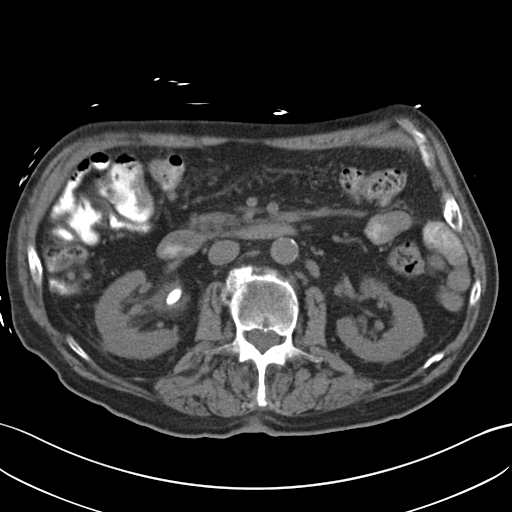
[im 70/100  soft-tissue]
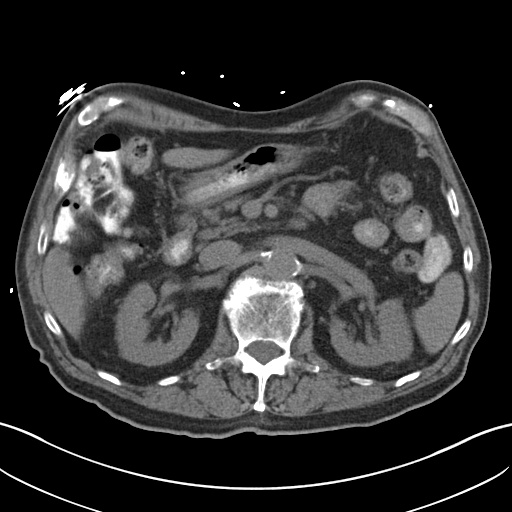
[im 70/100  bone]
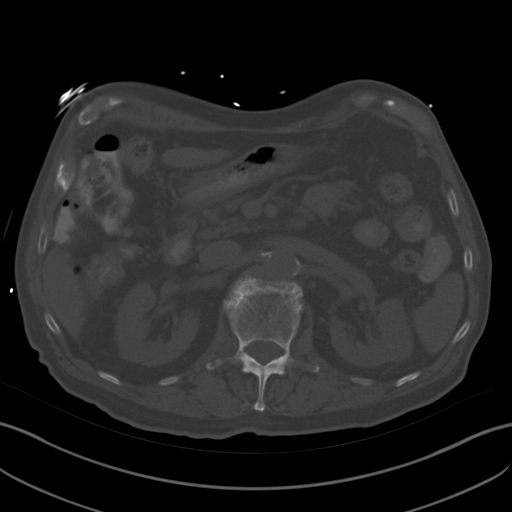
[im 78/100  soft-tissue]
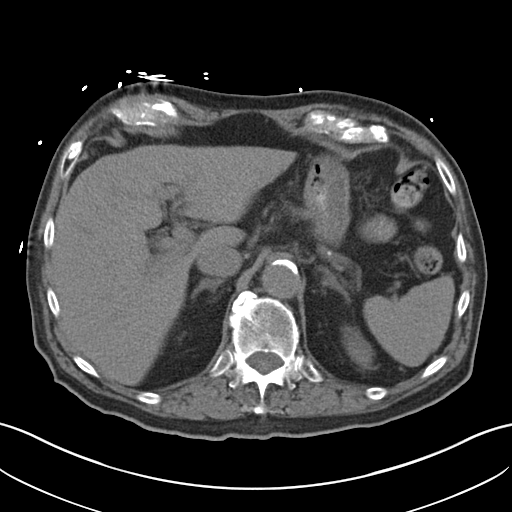
[im 85/100  soft-tissue]
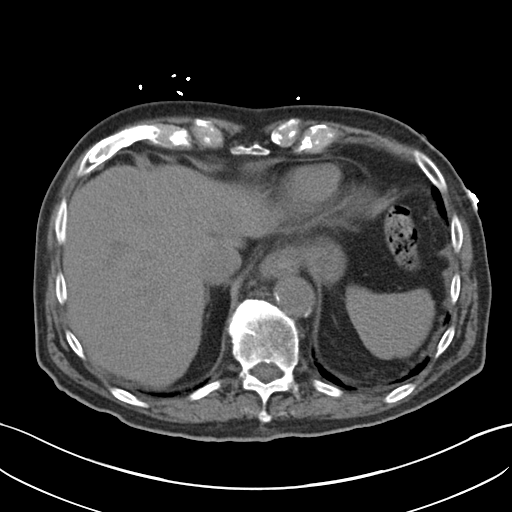
[im 92/100  soft-tissue]
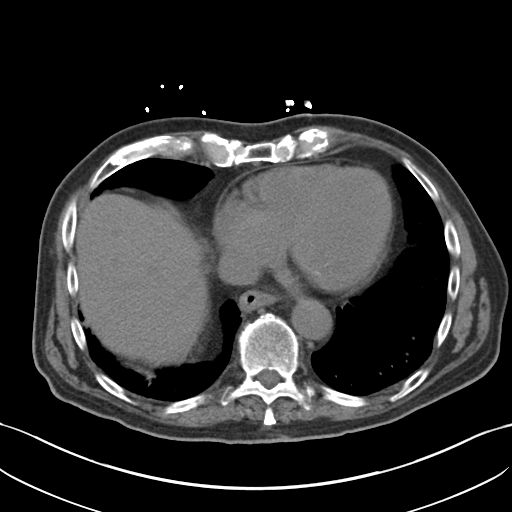

[Series 4: coronal · coronal · 0.74mm/px · 3 of 86 slices shown]
[im 29/86  soft-tissue]
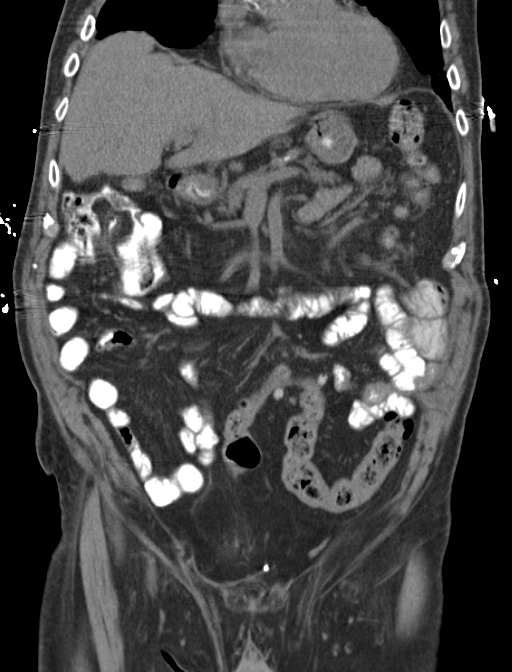
[im 38/86  soft-tissue]
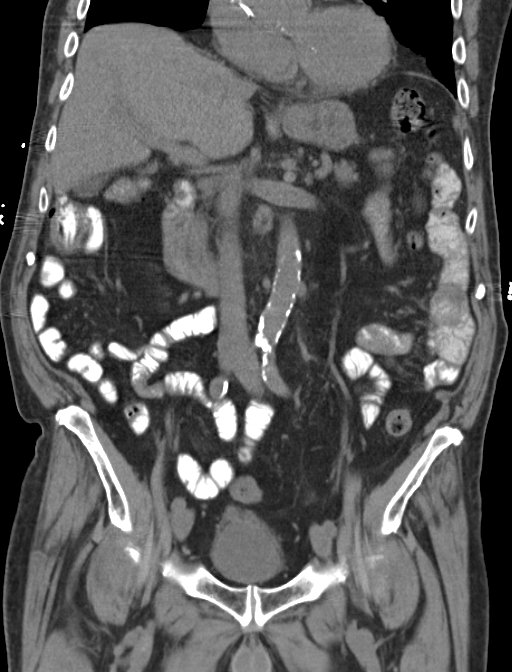
[im 48/86  soft-tissue]
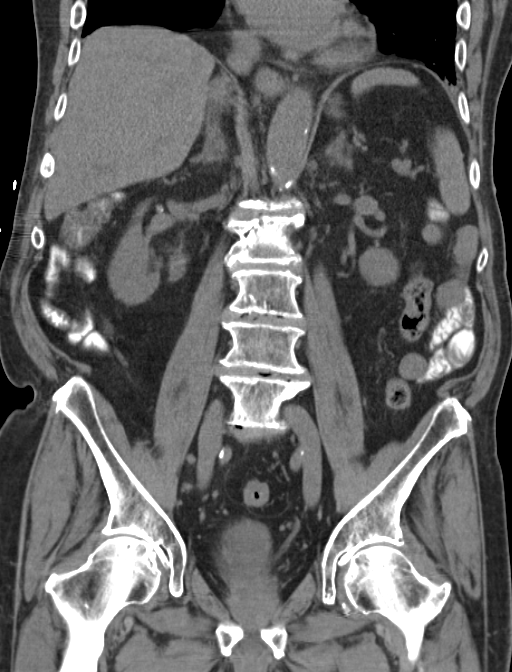

[15 of 46 positions shown; findings below may reference images not displayed]

FINDINGS: Mild bibasilar atelectasis or scarring is noted. There is aneurysmal
dilatation of the ascending thoracic aorta to 4.5 cm in AP
dimension. Diffuse coronary artery calcifications are seen.

The liver and spleen are unremarkable in appearance. The gallbladder
is within normal limits. The pancreas and adrenal glands are
unremarkable.

There is a large 1.9 x 1.5 cm stone at the right renal pelvis, with
surrounding soft tissue inflammation. This may reflect some degree
of infection. Mild nonspecific perinephric stranding is noted
bilaterally. A small right renal cyst is seen, measuring 1.2 cm. No
additional renal or ureteral stones are identified. No significant
hydronephrosis is seen.

No free fluid is identified. The small bowel is unremarkable in
appearance. The stomach is within normal limits. No acute vascular
abnormalities are seen. Scattered calcification is noted along the
abdominal aorta and its branches.

The appendix is not definitely characterized; there is no evidence
for appendicitis. Scattered diverticulosis is noted along the
sigmoid colon, without evidence of diverticulitis. The rectum is
mildly distended with stool, measuring up to 7.8 cm in diameter.

The bladder is mildly distended. Apparent bladder wall irregularity
and surrounding soft tissue inflammation raises concern for
cystitis, though a mass cannot be excluded. The remaining prostate
is normal in size. No inguinal lymphadenopathy is seen.

No acute osseous abnormalities are identified.
IMPRESSION: 1. Apparent bladder wall irregularity and surrounding soft tissue
inflammation raises concern for cystitis. Given the patient's red
blood cells in the urine, a mass cannot be entirely excluded. If the
patient's symptoms persist, cystoscopy could be considered.
2. Large 1.9 x 1.5 cm stone at the right renal pelvis, with
surrounding soft inflammation. This may reflect some degree of
infection. No evidence of hydronephrosis.
3. Small right renal cyst seen.
4. Mild bibasilar atelectasis or scarring noted.
5. Aneurysmal dilatation of the ascending thoracic aorta to 4.5 cm
in AP dimension. This tends to remain within normal limits given the
patient's age.
6. Diffuse coronary artery calcifications seen
7. Scattered calcification along the abdominal aorta and its
branches.
8. Scattered diverticulosis along the sigmoid colon, without
evidence of diverticulitis.
9. Rectum mildly distended with stool, measuring up to 7.8 cm in
diameter acute.

## 2017-01-31 IMAGING — DX DG CHEST 1V PORT
1 series · 1 of 1 positions shown · non-contrast
Comparison: Chest radiograph performed 02/28/2010

CLINICAL DATA: Acute onset of fever and altered mental status.
Initial encounter.

EXAM:
PORTABLE CHEST 1 VIEW

[chest ap]
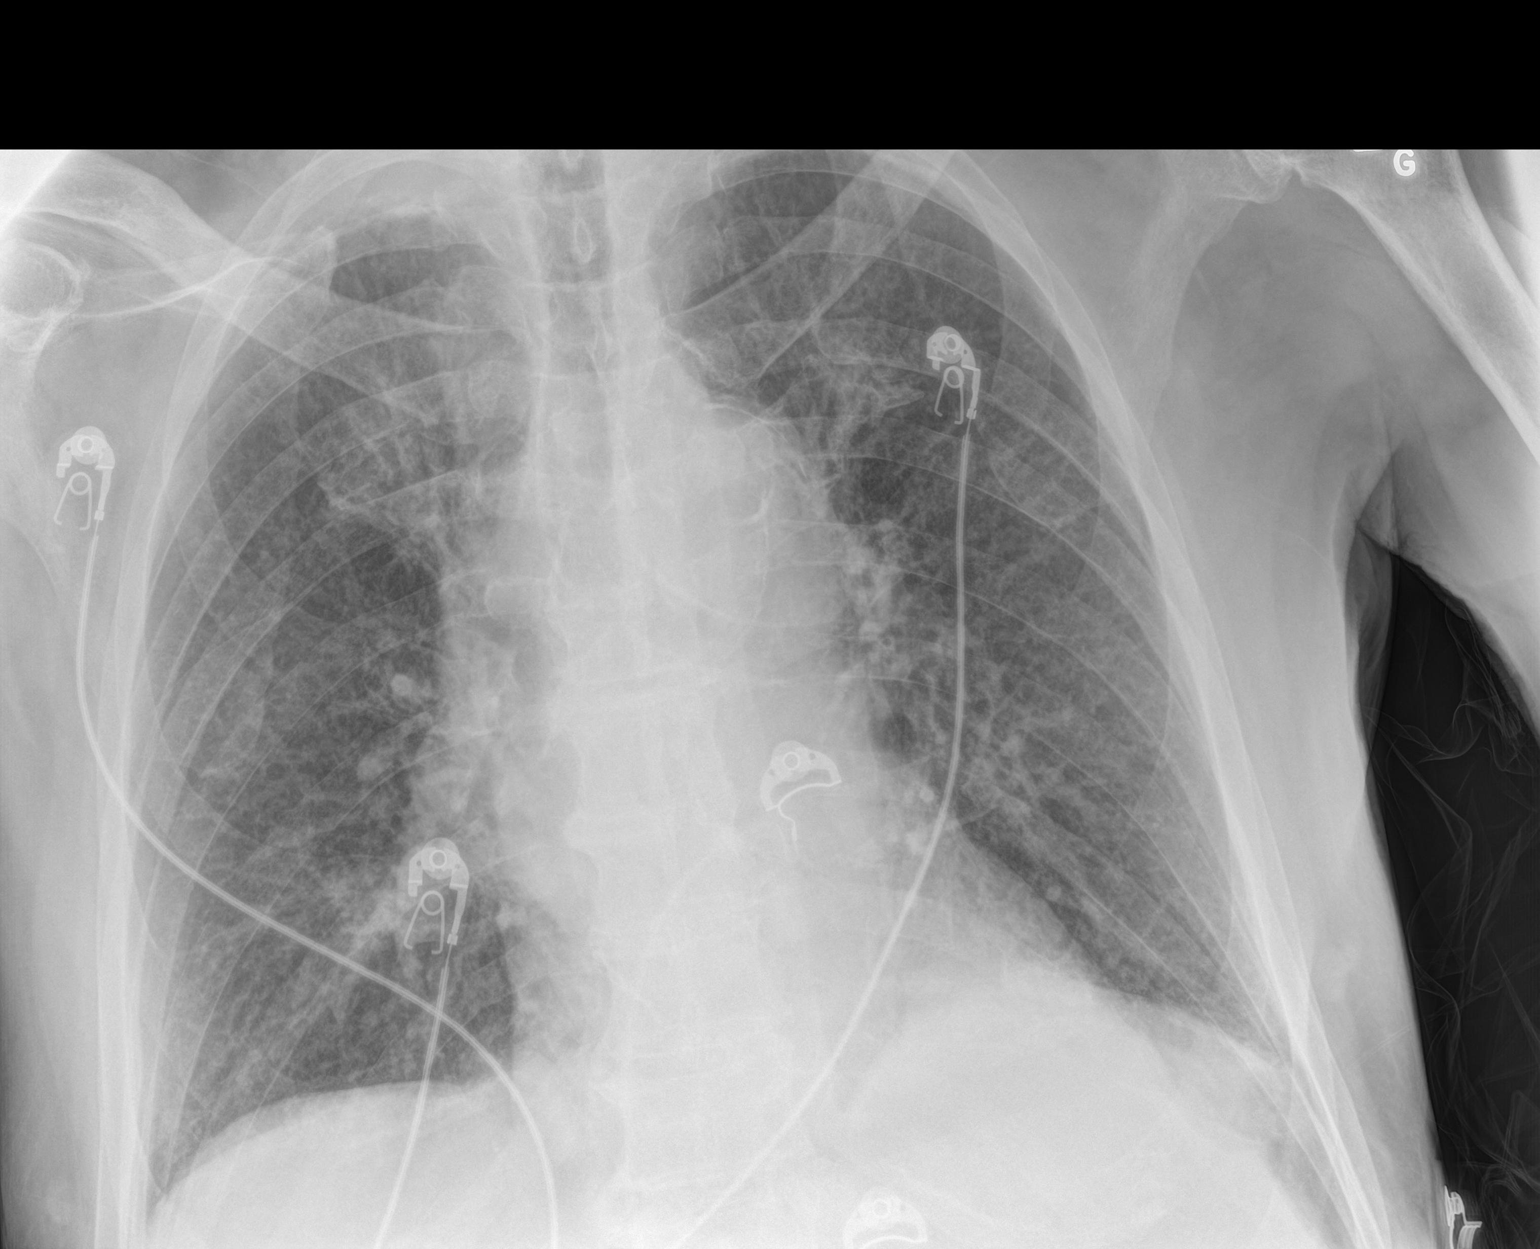

[1 of 1 positions shown; findings below may reference images not displayed]

FINDINGS: The lungs are well-aerated. Vascular congestion is noted. Increased
interstitial markings raise concern for mild interstitial edema,
though mild pneumonia might have a similar appearance, given the
patient's symptoms. There is no evidence of pleural effusion or
pneumothorax.

The cardiomediastinal silhouette is borderline normal in size. No
acute osseous abnormalities are seen.
IMPRESSION: Vascular congestion noted. Increased interstitial markings raise
concern for mild interstitial edema, though mild pneumonia might
have a similar appearance, given the patient's symptoms.
